# Patient Record
Sex: Female | Born: 1935 | Race: White | Hispanic: No | Smoking: Never smoker
Health system: Southern US, Community
[De-identification: ages and names within clinical notes are randomized; demographics above are authoritative.]

## PROBLEM LIST (undated history)

## (undated) DIAGNOSIS — Z853 Personal history of malignant neoplasm of breast: Secondary | ICD-10-CM

## (undated) DIAGNOSIS — C50419 Malignant neoplasm of upper-outer quadrant of unspecified female breast: Secondary | ICD-10-CM

## (undated) DIAGNOSIS — C801 Malignant (primary) neoplasm, unspecified: Secondary | ICD-10-CM

## (undated) DIAGNOSIS — I4891 Unspecified atrial fibrillation: Secondary | ICD-10-CM

## (undated) DIAGNOSIS — Z86010 Personal history of colonic polyps: Secondary | ICD-10-CM

## (undated) DIAGNOSIS — M810 Age-related osteoporosis without current pathological fracture: Secondary | ICD-10-CM

## (undated) HISTORY — DX: Age-related osteoporosis without current pathological fracture: M81.0

## (undated) HISTORY — DX: Personal history of malignant neoplasm of breast: Z85.3

## (undated) HISTORY — PX: TUBAL LIGATION: SHX77

## (undated) HISTORY — PX: COLONOSCOPY: SHX174

## (undated) HISTORY — DX: Malignant (primary) neoplasm, unspecified: C80.1

## (undated) HISTORY — PX: HAND SURGERY: SHX662

## (undated) HISTORY — DX: Unspecified atrial fibrillation: I48.91

## (undated) HISTORY — DX: Personal history of colonic polyps: Z86.010

## (undated) HISTORY — DX: Malignant neoplasm of upper-outer quadrant of unspecified female breast: C50.419

## (undated) HISTORY — PX: APPENDECTOMY: SHX54

---

## 1992-06-28 HISTORY — PX: COLON RESECTION: SHX5231

## 1999-06-29 DIAGNOSIS — C801 Malignant (primary) neoplasm, unspecified: Secondary | ICD-10-CM

## 1999-06-29 HISTORY — DX: Malignant (primary) neoplasm, unspecified: C80.1

## 1999-06-29 HISTORY — PX: BREAST LUMPECTOMY: SHX2

## 2004-04-06 ENCOUNTER — Ambulatory Visit: Payer: Self-pay | Admitting: Oncology

## 2004-09-28 ENCOUNTER — Ambulatory Visit: Payer: Self-pay | Admitting: General Surgery

## 2004-10-05 ENCOUNTER — Ambulatory Visit: Payer: Self-pay | Admitting: General Surgery

## 2004-10-06 ENCOUNTER — Ambulatory Visit: Payer: Self-pay | Admitting: Oncology

## 2005-03-31 ENCOUNTER — Ambulatory Visit: Payer: Self-pay | Admitting: General Surgery

## 2005-04-13 ENCOUNTER — Ambulatory Visit: Payer: Self-pay | Admitting: Oncology

## 2005-04-28 ENCOUNTER — Ambulatory Visit: Payer: Self-pay | Admitting: Oncology

## 2005-06-28 HISTORY — PX: MASTECTOMY: SHX3

## 2005-10-04 ENCOUNTER — Ambulatory Visit: Payer: Self-pay | Admitting: General Surgery

## 2005-10-07 ENCOUNTER — Ambulatory Visit: Payer: Self-pay | Admitting: General Surgery

## 2005-10-14 ENCOUNTER — Ambulatory Visit: Payer: Self-pay | Admitting: Oncology

## 2005-11-02 ENCOUNTER — Ambulatory Visit: Payer: Self-pay | Admitting: General Surgery

## 2005-11-02 ENCOUNTER — Other Ambulatory Visit: Payer: Self-pay

## 2005-11-08 ENCOUNTER — Ambulatory Visit: Payer: Self-pay | Admitting: General Surgery

## 2005-12-02 ENCOUNTER — Inpatient Hospital Stay: Payer: Self-pay | Admitting: General Surgery

## 2006-08-12 ENCOUNTER — Ambulatory Visit: Payer: Self-pay | Admitting: General Surgery

## 2006-09-27 ENCOUNTER — Ambulatory Visit: Payer: Self-pay | Admitting: Internal Medicine

## 2006-10-13 ENCOUNTER — Ambulatory Visit: Payer: Self-pay | Admitting: Oncology

## 2006-10-17 ENCOUNTER — Ambulatory Visit: Payer: Self-pay | Admitting: General Surgery

## 2006-10-27 ENCOUNTER — Ambulatory Visit: Payer: Self-pay | Admitting: Oncology

## 2006-10-27 ENCOUNTER — Ambulatory Visit: Payer: Self-pay | Admitting: Internal Medicine

## 2007-03-29 ENCOUNTER — Ambulatory Visit: Payer: Self-pay | Admitting: Internal Medicine

## 2007-04-11 ENCOUNTER — Ambulatory Visit: Payer: Self-pay | Admitting: Internal Medicine

## 2007-04-29 ENCOUNTER — Ambulatory Visit: Payer: Self-pay | Admitting: Internal Medicine

## 2007-06-29 HISTORY — PX: SKIN CANCER EXCISION: SHX779

## 2007-09-27 ENCOUNTER — Ambulatory Visit: Payer: Self-pay | Admitting: Internal Medicine

## 2007-10-10 ENCOUNTER — Ambulatory Visit: Payer: Self-pay | Admitting: Internal Medicine

## 2007-10-27 ENCOUNTER — Ambulatory Visit: Payer: Self-pay | Admitting: Internal Medicine

## 2007-11-06 ENCOUNTER — Ambulatory Visit: Payer: Self-pay | Admitting: General Surgery

## 2008-01-27 ENCOUNTER — Ambulatory Visit: Payer: Self-pay | Admitting: Internal Medicine

## 2008-02-15 ENCOUNTER — Ambulatory Visit: Payer: Self-pay | Admitting: Internal Medicine

## 2008-02-27 ENCOUNTER — Ambulatory Visit: Payer: Self-pay | Admitting: Internal Medicine

## 2008-04-25 ENCOUNTER — Ambulatory Visit: Payer: Self-pay | Admitting: Ophthalmology

## 2008-05-14 ENCOUNTER — Ambulatory Visit: Payer: Self-pay | Admitting: Ophthalmology

## 2008-06-28 DIAGNOSIS — Z860100 Personal history of colon polyps, unspecified: Secondary | ICD-10-CM

## 2008-06-28 HISTORY — DX: Personal history of colon polyps, unspecified: Z86.0100

## 2008-06-28 HISTORY — DX: Personal history of colonic polyps: Z86.010

## 2008-07-02 ENCOUNTER — Ambulatory Visit: Payer: Self-pay | Admitting: Ophthalmology

## 2008-07-29 ENCOUNTER — Ambulatory Visit: Payer: Self-pay | Admitting: Internal Medicine

## 2008-08-13 ENCOUNTER — Ambulatory Visit: Payer: Self-pay | Admitting: Internal Medicine

## 2008-08-26 ENCOUNTER — Ambulatory Visit: Payer: Self-pay | Admitting: Internal Medicine

## 2008-11-06 ENCOUNTER — Ambulatory Visit: Payer: Self-pay | Admitting: General Surgery

## 2009-02-14 ENCOUNTER — Ambulatory Visit: Payer: Self-pay | Admitting: Internal Medicine

## 2009-02-26 ENCOUNTER — Ambulatory Visit: Payer: Self-pay | Admitting: Internal Medicine

## 2009-07-29 ENCOUNTER — Ambulatory Visit: Payer: Self-pay | Admitting: Internal Medicine

## 2009-08-19 ENCOUNTER — Ambulatory Visit: Payer: Self-pay | Admitting: Internal Medicine

## 2009-08-26 ENCOUNTER — Ambulatory Visit: Payer: Self-pay | Admitting: Internal Medicine

## 2009-08-29 ENCOUNTER — Ambulatory Visit: Payer: Self-pay | Admitting: General Surgery

## 2009-11-17 ENCOUNTER — Ambulatory Visit: Payer: Self-pay | Admitting: General Surgery

## 2010-06-28 DIAGNOSIS — M810 Age-related osteoporosis without current pathological fracture: Secondary | ICD-10-CM

## 2010-06-28 HISTORY — DX: Age-related osteoporosis without current pathological fracture: M81.0

## 2010-08-14 ENCOUNTER — Ambulatory Visit: Payer: Self-pay | Admitting: Internal Medicine

## 2010-08-27 ENCOUNTER — Ambulatory Visit: Payer: Self-pay | Admitting: Internal Medicine

## 2010-11-19 ENCOUNTER — Ambulatory Visit: Payer: Self-pay | Admitting: General Surgery

## 2011-12-02 ENCOUNTER — Ambulatory Visit: Payer: Self-pay | Admitting: General Surgery

## 2012-09-05 ENCOUNTER — Encounter: Payer: Self-pay | Admitting: *Deleted

## 2012-09-05 DIAGNOSIS — Z860100 Personal history of colon polyps, unspecified: Secondary | ICD-10-CM | POA: Insufficient documentation

## 2012-09-18 ENCOUNTER — Ambulatory Visit (INDEPENDENT_AMBULATORY_CARE_PROVIDER_SITE_OTHER): Payer: Medicare Other | Admitting: General Surgery

## 2012-09-18 ENCOUNTER — Encounter: Payer: Self-pay | Admitting: General Surgery

## 2012-09-18 ENCOUNTER — Other Ambulatory Visit: Payer: Self-pay | Admitting: General Surgery

## 2012-09-18 VITALS — BP 116/68 | HR 70 | Resp 16 | Ht 60.0 in | Wt 132.0 lb

## 2012-09-18 DIAGNOSIS — C50911 Malignant neoplasm of unspecified site of right female breast: Secondary | ICD-10-CM

## 2012-09-18 DIAGNOSIS — C50919 Malignant neoplasm of unspecified site of unspecified female breast: Secondary | ICD-10-CM | POA: Insufficient documentation

## 2012-09-18 DIAGNOSIS — Z86010 Personal history of colonic polyps: Secondary | ICD-10-CM

## 2012-09-18 MED ORDER — POLYETHYLENE GLYCOL 3350 17 GM/SCOOP PO POWD: ORAL | Status: DC

## 2012-09-18 NOTE — Progress Notes (Signed)
Patient ID: Mindy Garza, female   DOB: 07-21-1925, 79 y.o.   MRN: [id number]  Chief Complaint  Patient presents with  . Colonoscopy    HPI Mindy Garza is a 79 y.o. female. Patient presents for a colonoscopy. Patient's last colonoscopy was done in 2010. She has a history of polyps in the past. She states she has no complaints at this time.  HPI  Past Medical History  Diagnosis Date  . Cancer 2001     right breast cancer diagnosed December 02, 2005 status post modified radical mastectomy. The patient had had an invasive carcinoma recurrence in five years after treatment for DCIS with wide excision and postoperative radiation therapy. Her tumor was estrogen positive. 0/15 nodes were negative. An axillary dissection was completed as a sentinel node could not be identified.   . Osteoporosis 2012  . Personal history of malignant neoplasm of breast 2001,2007  . Malignant neoplasm of upper-outer quadrant of female breast     Right, T1a, N0, M0 ER-positive, PR negative, HER-2/neu not overexpressing.    . Personal history of colonic polyps 2010    tubulovillous polyp of the appendiceal orifice      Past Surgical History  Procedure Laterality Date  . Colonoscopy  2010    Dr. Lemar Livings  . Skin cancer excision  2009  . Breast lumpectomy Right 2001  . Mastectomy Right 2007  . Colon resection  1994  . Hand surgery    . Tubal ligation    . Appendectomy      Family History  Problem Relation Age of Onset  . Breast cancer      Social History History  Substance Use Topics  . Smoking status: Never Smoker   . Smokeless tobacco: Never Used  . Alcohol Use: No    No Known Allergies  Current Outpatient Prescriptions  Medication Sig Dispense Refill  . aspirin 81 MG tablet Take 81 mg by mouth daily.      . brimonidine-timolol (COMBIGAN) 0.2-0.5 % ophthalmic solution Place 1 drop into both eyes once.      . Calcium Carbonate-Vitamin D (CALCIUM 500/VITAMIN D PO) Take 1,500 mg by mouth daily.      .  cyanocobalamin 100 MCG tablet Take 1,000 mcg by mouth as directed.      . fish oil-omega-3 fatty acids 1000 MG capsule Take 2 g by mouth daily.      . fluticasone (FLONASE) 50 MCG/ACT nasal spray Place 2 sprays into the nose daily.      Marland Kitchen LORazepam (ATIVAN) 1 MG tablet Take 1 mg by mouth every 8 (eight) hours.      . Multiple Vitamin (MULTIVITAMIN) tablet Take 1 tablet by mouth daily.      . polyethylene glycol powder (GLYCOLAX/MIRALAX) powder 255 grams one bottle for colonoscopy prep  255 g  0   No current facility-administered medications for this visit.    Review of Systems Review of Systems  Constitutional: Negative.   Respiratory: Negative.   Cardiovascular: Negative.   Gastrointestinal: Negative.     Blood pressure 116/68, pulse 70, resp. rate 16, height 5' (1.524 m), weight 132 lb (59.875 kg).  Physical Exam Physical Exam  Constitutional: She appears well-developed and well-nourished.  Cardiovascular: Normal rate, regular rhythm and normal heart sounds.   Pulmonary/Chest: Effort normal and breath sounds normal.  Abdominal: Soft. Normal appearance. She exhibits no mass.    Data Reviewed 2010 colonoscopy results  Assessment    Tubulovillous adenoma of the appendiceal orifice.  Plan    The recommendation for follow up colonoscopy was reviewed with the patient in person and with her daughter, Cephus Slater, R.N. by phone.       Mindy Garza 09/18/2012, 7:04 PM

## 2012-09-18 NOTE — Patient Instructions (Signed)
Patient has been scheduled for a colonoscopy on 11-08-12 at [facility].

## 2012-10-30 ENCOUNTER — Telehealth: Payer: Self-pay | Admitting: *Deleted

## 2012-10-30 NOTE — Telephone Encounter (Signed)
Patient reports that she is on the same medications since last office visit. She has already stopped fish oil. Patient also thinks her daughter has taken care of pre-registering her for colonoscopy. We will proceed with colonoscopy that is scheduled at [facility] for 11-08-12. This patient is aware to call if she has further questions.

## 2012-11-08 ENCOUNTER — Ambulatory Visit: Payer: Self-pay | Admitting: General Surgery

## 2012-11-08 DIAGNOSIS — D126 Benign neoplasm of colon, unspecified: Secondary | ICD-10-CM

## 2012-11-09 LAB — PATHOLOGY REPORT

## 2012-11-13 ENCOUNTER — Telehealth: Payer: Self-pay | Admitting: General Surgery

## 2012-11-13 NOTE — Telephone Encounter (Signed)
The patient's colonoscopy of 11/08/2012 showed enlargement of the previous identified tubulovillous adenoma of the appendiceal orifice. Pathology did not show any dysplasia.  Options for management were discussed: 1) "radical appendectomy" to remove all the villous adenoma tissue versus 2) continue surveillance endoscopy.  Mrs. Mindy Garza will talk with her mother in person and will make arrangements for an afternoon appointment to discuss options in person.

## 2012-11-15 ENCOUNTER — Encounter: Payer: Self-pay | Admitting: General Surgery

## 2012-12-05 ENCOUNTER — Ambulatory Visit: Payer: Self-pay | Admitting: General Surgery

## 2012-12-06 ENCOUNTER — Encounter: Payer: Self-pay | Admitting: General Surgery

## 2012-12-19 ENCOUNTER — Encounter: Payer: Self-pay | Admitting: General Surgery

## 2012-12-19 ENCOUNTER — Ambulatory Visit (INDEPENDENT_AMBULATORY_CARE_PROVIDER_SITE_OTHER): Payer: Medicare Other | Admitting: General Surgery

## 2012-12-19 VITALS — BP 116/62 | HR 72 | Resp 14 | Ht 63.0 in | Wt 132.0 lb

## 2012-12-19 DIAGNOSIS — C50919 Malignant neoplasm of unspecified site of unspecified female breast: Secondary | ICD-10-CM

## 2012-12-19 DIAGNOSIS — Z86010 Personal history of colonic polyps: Secondary | ICD-10-CM

## 2012-12-19 DIAGNOSIS — C50911 Malignant neoplasm of unspecified site of right female breast: Secondary | ICD-10-CM

## 2012-12-19 NOTE — Patient Instructions (Addendum)
Continue self breast exams. Call office for any new breast issues or concerns. Follow up in 1 yr left mammogram and office visit  Risk and benefits reviewed regarding surgery for polyp removal, with one night stay in hospital with the plan for resuming her activity over a week.  Patient's surgery has been scheduled for 02-01-13 at [facility]. It is okay for patient to continue 81 mg aspirin.

## 2012-12-19 NOTE — Progress Notes (Addendum)
Patient ID: Mindy Garza, female   DOB: 09/18/1925, 79 y.o.   MRN: [id number]  Chief Complaint  Patient presents with  . Follow-up    mammogram    HPI Mindy Garza is a 79 y.o. female.  Patient here today for follow up left mammogram. Follow up from colonoscopy done 11-08-12 as well. Patient with known history of right breast cancer. No new breast complaints.  Patient had right breast cancer diagnosed December 02, 2005 status post modified radical mastectomy. The patient had had an invasive carcinoma recurrence in five years after treatment for DCIS with wide excision and postoperative radiation therapy. Her tumor was estrogen positive. 0/15 nodes were negative. An axillary dissection was completed as a sentinel node could not be identified.  The patient is accompanied today by her daughter and son-in-law.  She reports no difficulty with her right mastectomy site. She has not had any GI difficulties after her colonoscopy, although she reports it does take her quite a while to get over the prep. HPI  Past Medical History  Diagnosis Date  . Cancer 2001     right breast cancer diagnosed December 02, 2005 status post modified radical mastectomy. The patient had had an invasive carcinoma recurrence in five years after treatment for DCIS with wide excision and postoperative radiation therapy. Her tumor was estrogen positive. 0/15 nodes were negative. An axillary dissection was completed as a sentinel node could not be identified.   . Osteoporosis 2012  . Personal history of malignant neoplasm of breast 2001,2007  . Malignant neoplasm of upper-outer quadrant of female breast     Right, T1a, N0, M0 ER-positive, PR negative, HER-2/neu not overexpressing.    . Personal history of colonic polyps 2010    tubulovillous polyp of the appendiceal orifice      Past Surgical History  Procedure Laterality Date  . Colonoscopy  2010, 2014    Dr. Lemar Livings  . Skin cancer excision  2009  . Breast lumpectomy Right 2001  .  Mastectomy Right 2007  . Colon resection  1994  . Hand surgery    . Tubal ligation    . Appendectomy      Family History  Problem Relation Age of Onset  . Breast cancer      Social History History  Substance Use Topics  . Smoking status: Never Smoker   . Smokeless tobacco: Never Used  . Alcohol Use: No    No Known Allergies  Current Outpatient Prescriptions  Medication Sig Dispense Refill  . aspirin 81 MG tablet Take 81 mg by mouth daily.      . brimonidine-timolol (COMBIGAN) 0.2-0.5 % ophthalmic solution Place 1 drop into both eyes once.      . Calcium Carbonate-Vitamin D (CALCIUM 500/VITAMIN D PO) Take 1,500 mg by mouth daily.      . Cyanocobalamin (VITAMIN B-12 IJ) Inject 1 ampule as directed every 30 (thirty) days.      . fish oil-omega-3 fatty acids 1000 MG capsule Take 2 g by mouth daily.      . fluticasone (FLONASE) 50 MCG/ACT nasal spray Place 2 sprays into the nose daily.      Marland Kitchen LORazepam (ATIVAN) 1 MG tablet Take 1 mg by mouth every 8 (eight) hours.      . Multiple Vitamin (MULTIVITAMIN) tablet Take 1 tablet by mouth daily.       No current facility-administered medications for this visit.    Review of Systems Review of Systems  Constitutional: Negative.   Respiratory:  Negative.   Cardiovascular: Negative.     Blood pressure 116/62, pulse 72, resp. rate 14, height 5\' 3"  (1.6 m), weight 132 lb (59.875 kg).  Physical Exam Physical Exam  Constitutional: She is oriented to person, place, and time. She appears well-developed and well-nourished.  Cardiovascular: Normal rate, regular rhythm and normal heart sounds.   Pulmonary/Chest: Left breast exhibits no inverted nipple, no mass, no nipple discharge, no skin change and no tenderness.  Abdominal: Soft.  Lymphadenopathy:    She has no cervical adenopathy.    She has no axillary adenopathy.  Neurological: She is alert and oriented to person, place, and time.  Skin: Skin is warm and dry.  Scoliosis  Right  mastectomy site well healed scar  Data Reviewed 25 mm tubulovillous adenoma the appendiceal orifice. No atypia or dysplasia identified. Left breast mammogram dated 12/05/2012 was unremarkable. BI-RAD-1.  Assessment    Doing well status post right mastectomy without evidence of recurrent disease.  Enlarging tubulovillous adenoma the appendiceal orifice.    Plan    Followup left breast mammogram in one year.  Options for management of the polyp in the cecum were reviewed. There is evidence that a limited resection for biopsy proven benign lesions is an acceptable approach. This would essentially be an "radical" appendectomy involving the cecum without an anastomosis.  The risks associated with surgery including those related to bleeding infection, scar tissue, pain and the like were reviewed. Her daughter is a Engineer, civil (consulting), her son-in-law physician. We're all aware of the risks associated with surgery in an 79 year old woman.    Patient's surgery has been scheduled for 02-01-13 at [facility]. It is okay for patient to continue 81 mg aspirin.    Earline Mayotte 12/20/2012, 7:25 AM

## 2012-12-20 ENCOUNTER — Encounter: Payer: Self-pay | Admitting: General Surgery

## 2013-01-22 ENCOUNTER — Other Ambulatory Visit: Payer: Self-pay | Admitting: General Surgery

## 2013-01-22 DIAGNOSIS — Z86010 Personal history of colonic polyps: Secondary | ICD-10-CM

## 2013-01-23 ENCOUNTER — Observation Stay: Payer: Self-pay | Admitting: Internal Medicine

## 2013-01-23 ENCOUNTER — Telehealth: Payer: Self-pay | Admitting: *Deleted

## 2013-01-23 ENCOUNTER — Ambulatory Visit: Payer: Self-pay | Admitting: General Surgery

## 2013-01-23 DIAGNOSIS — I498 Other specified cardiac arrhythmias: Secondary | ICD-10-CM

## 2013-01-23 LAB — CBC WITH DIFFERENTIAL/PLATELET
Basophil #: 0.1 10*3/uL (ref 0.0–0.1)
Basophil %: 0.6 %
Eosinophil %: 3.2 %
HCT: 42 % (ref 35.0–47.0)
HGB: 14.5 g/dL (ref 12.0–16.0)
Lymphocyte #: 2 10*3/uL (ref 1.0–3.6)
MCV: 88 fL (ref 80–100)
Monocyte %: 8.6 %
Neutrophil %: 67.1 %
RBC: 4.79 10*6/uL (ref 3.80–5.20)
RDW: 14.1 % (ref 11.5–14.5)

## 2013-01-23 LAB — TROPONIN I: Troponin-I: <0.02 ng/mL

## 2013-01-23 LAB — URINALYSIS, COMPLETE
Bilirubin,UR: NEGATIVE
Blood: NEGATIVE
Glucose,UR: NEGATIVE mg/dL (ref 0–75)
Leukocyte Esterase: NEGATIVE
Nitrite: NEGATIVE
Ph: 7 (ref 4.5–8.0)
Protein: NEGATIVE
RBC,UR: <1 /HPF (ref 0–5)
Squamous Epithelial: <1

## 2013-01-23 LAB — TSH: Thyroid Stimulating Horm: 1.18 u[IU]/mL

## 2013-01-23 LAB — BASIC METABOLIC PANEL
Creatinine: 0.8 mg/dL (ref 0.60–1.30)
EGFR (Non-African Amer.): >60
Glucose: 100 mg/dL — ABNORMAL HIGH (ref 65–99)
Osmolality: 277 (ref 275–301)

## 2013-01-23 NOTE — Telephone Encounter (Signed)
Per Toni Amend at the answering service, Cordelia Pen with anesthesia called wanting [place] to know that Pre-admit sent patient to the Emergency Room due to the EKG that she had done today.  Dr. Lemar Livings has been notified.

## 2013-01-24 LAB — MAGNESIUM: Magnesium: 2.2 mg/dL

## 2013-01-24 LAB — LIPID PANEL: Cholesterol: 166 mg/dL (ref 0–200)

## 2013-01-24 NOTE — Addendum Note (Signed)
Addended by: Earline Mayotte on: 01/24/2013 02:02 PM   Modules accepted: Orders

## 2013-01-25 ENCOUNTER — Telehealth: Payer: Self-pay | Admitting: *Deleted

## 2013-01-25 NOTE — Telephone Encounter (Signed)
Patient's surgery that was scheduled for 02-01-13 has been cancelled at this time. She is to see Dr. Juliann Pares either 01-26-13 or 01-29-13. We will be awaiting cardiac clearance and once obtained we can reschedule surgery.  Message left on the O.R. posting line to cancel case.

## 2013-03-07 ENCOUNTER — Ambulatory Visit: Payer: Self-pay | Admitting: Cardiology

## 2013-10-02 ENCOUNTER — Ambulatory Visit: Payer: Self-pay | Admitting: Internal Medicine

## 2013-11-05 ENCOUNTER — Ambulatory Visit: Payer: Self-pay | Admitting: Orthopedic Surgery

## 2013-12-25 ENCOUNTER — Encounter: Payer: Self-pay | Admitting: General Surgery

## 2013-12-25 ENCOUNTER — Ambulatory Visit: Payer: Self-pay | Admitting: General Surgery

## 2013-12-26 ENCOUNTER — Encounter: Payer: Self-pay | Admitting: General Surgery

## 2013-12-31 ENCOUNTER — Encounter: Payer: Self-pay | Admitting: General Surgery

## 2013-12-31 ENCOUNTER — Ambulatory Visit (INDEPENDENT_AMBULATORY_CARE_PROVIDER_SITE_OTHER): Payer: Medicare Other | Admitting: General Surgery

## 2013-12-31 VITALS — BP 126/80 | HR 140 | Resp 16 | Ht 63.0 in | Wt 130.0 lb

## 2013-12-31 DIAGNOSIS — I4891 Unspecified atrial fibrillation: Secondary | ICD-10-CM

## 2013-12-31 DIAGNOSIS — R000 Tachycardia, unspecified: Secondary | ICD-10-CM

## 2013-12-31 DIAGNOSIS — Z853 Personal history of malignant neoplasm of breast: Secondary | ICD-10-CM

## 2013-12-31 DIAGNOSIS — I482 Chronic atrial fibrillation, unspecified: Secondary | ICD-10-CM | POA: Insufficient documentation

## 2013-12-31 NOTE — Progress Notes (Signed)
Patient ID: Mindy Garza, female   DOB: 18-Dec-1925, 80 y.o.   MRN: [id number]  Chief Complaint  Patient presents with  . Follow-up    mammogram    HPI Mindy Garza is a 80 y.o. female who presents for a breast evaluation. The most recent left breast mammogram was done on 12/25/13. Patient does perform regular self breast checks and gets regular mammograms done.  The patient denies any problems with her breast at this time.   HPI  Past Medical History  Diagnosis Date  . Cancer 2001     right breast cancer diagnosed December 02, 2005 status post modified radical mastectomy. The patient had had an invasive carcinoma recurrence in five years after treatment for DCIS with wide excision and postoperative radiation therapy. Her tumor was estrogen positive. 0/15 nodes were negative. An axillary dissection was completed as a sentinel node could not be identified.   . Osteoporosis 2012  . Personal history of malignant neoplasm of breast 2001,2007  . Malignant neoplasm of upper-outer quadrant of female breast     Right, T1a, N0, M0 ER-positive, PR negative, HER-2/neu not overexpressing.    . Personal history of colonic polyps 2010    tubulovillous polyp of the appendiceal orifice    . Atrial fibrillation     followed by Jordan Hawks, MD    Past Surgical History  Procedure Laterality Date  . Colonoscopy  2010, 2014    Dr. Bary Castilla  . Skin cancer excision  2009  . Breast lumpectomy Right 2001  . Mastectomy Right 2007  . Colon resection  1994  . Hand surgery    . Tubal ligation    . Appendectomy      Family History  Problem Relation Age of Onset  . Breast cancer      Social History History  Substance Use Topics  . Smoking status: Never Smoker   . Smokeless tobacco: Never Used  . Alcohol Use: No    No Known Allergies  Current Outpatient Prescriptions  Medication Sig Dispense Refill  . brimonidine-timolol (COMBIGAN) 0.2-0.5 % ophthalmic solution Place 1 drop into both eyes once.      .  Calcium Carbonate-Vitamin D (CALCIUM 500/VITAMIN D PO) Take 1,500 mg by mouth daily.      . Cyanocobalamin (VITAMIN B-12 IJ) Inject 1 ampule as directed every 30 (thirty) days.      Marland Kitchen diltiazem (DILACOR XR) 180 MG 24 hr capsule Take 180 mg by mouth daily.      . fish oil-omega-3 fatty acids 1000 MG capsule Take 2 g by mouth daily.      . fluticasone (FLONASE) 50 MCG/ACT nasal spray Place 2 sprays into the nose daily.      Marland Kitchen LORazepam (ATIVAN) 1 MG tablet Take 1 mg by mouth every 8 (eight) hours.      . Multiple Vitamin (MULTIVITAMIN) tablet Take 1 tablet by mouth daily.      . vitamin E 1000 UNIT capsule Take 1,000 Units by mouth daily.      Marland Kitchen warfarin (COUMADIN) 1 MG tablet Take 1 mg by mouth daily.      . zoledronic acid (RECLAST) 5 MG/100ML SOLN injection Inject 5 mg into the vein once.       No current facility-administered medications for this visit.    Review of Systems Review of Systems  Constitutional: Negative.   Respiratory: Negative.   Cardiovascular: Negative.     Blood pressure 126/80, pulse 140, resp. rate 16, height 5'  3" (1.6 m), weight 130 lb (58.968 kg).  Physical Exam Physical Exam  Constitutional: She is oriented to person, place, and time. She appears well-developed and well-nourished.  Eyes: Conjunctivae are normal. No scleral icterus.  Neck: Neck supple.  Cardiovascular: Regular rhythm and normal heart sounds.  Tachycardia present.   No murmur heard. Afib Dr. Ubaldo Glassing to follow.  The rhythm is regular to auscultation. Peripherally, incompletely perfused beats are identified.  No jugular venous distention with the patient 45. With abdominal compressionthere is mild hepatojugular reflux.  Pulmonary/Chest: Effort normal and breath sounds normal. No accessory muscle usage. Not tachypneic. No respiratory distress. Left breast exhibits no inverted nipple, no mass, no nipple discharge, no skin change and no tenderness.  Well healed right mastectomy site.      Abdominal: Soft. Bowel sounds are normal. There is no tenderness.  Musculoskeletal:  Right calf measures 33.5  Left calf measures 34.5  Lymphadenopathy:    She has no cervical adenopathy.    She has no axillary adenopathy.  Neurological: She is alert and oriented to person, place, and time.  Skin: Skin is warm and dry.    Data Reviewed Left breast mammogram dated December 25, 2013 was reviewed and compared to previous studies. No evidence of malignancy. BI-RAD-1. Lower extremity ultrasound completed earlier this year showed no evidence of DVT.  Lower extremity MRI requested by the orthopedic service suggested asymmetric subcutaneous edema throughout the right lower leg with a small peripherally enhancing lesion 6 mm in diameter adjacent to the peroneal musculature.  Assessment     No evidence of recurrent right breast cancer.  Tachycardia with history of atrial fibrillation.    Plan    I spoke with Fulton Reek, M.D. Regarding the patient's elevated heart rate. He reports her last INR completed on December 05, 2013 was 2.7. She is being followed by Bartholome Bill, M.D. From cardiology.  If she is totally asymptomatic, arrangements have been made to be evaluated by Dr. Doy Hutching on Tuesday, July 7 at 2:45 PM.  The patient reported previous right calf swelling and orthopedic evaluation. In light of the absence of any evidence of calf swelling at this time I don't believe that any further followup is required.       PCP: Damaris Hippo 12/31/2013, 8:21 PM

## 2013-12-31 NOTE — Patient Instructions (Signed)
Patient to return in one year left breast screening mammogram.  

## 2014-04-29 ENCOUNTER — Encounter: Payer: Self-pay | Admitting: General Surgery

## 2014-05-01 LAB — BASIC METABOLIC PANEL
Anion Gap: 10 (ref 7–16)
BUN: 34 mg/dL — ABNORMAL HIGH (ref 7–18)
CO2: 29 mmol/L (ref 21–32)
Calcium, Total: 9 mg/dL (ref 8.5–10.1)
Chloride: 101 mmol/L (ref 98–107)
Creatinine: 1.49 mg/dL — ABNORMAL HIGH (ref 0.60–1.30)
EGFR (Non-African Amer.): 35 — ABNORMAL LOW
GFR CALC AF AMER: 43 — AB
Glucose: 153 mg/dL — ABNORMAL HIGH (ref 65–99)
Osmolality: 290 (ref 275–301)
Potassium: 3.8 mmol/L (ref 3.5–5.1)
Sodium: 140 mmol/L (ref 136–145)

## 2014-05-01 LAB — CBC
HCT: 48.3 % — ABNORMAL HIGH (ref 35.0–47.0)
HGB: 15.6 g/dL (ref 12.0–16.0)
MCH: 29.4 pg (ref 26.0–34.0)
MCHC: 32.3 g/dL (ref 32.0–36.0)
MCV: 91 fL (ref 80–100)
Platelet: 233 10*3/uL (ref 150–440)
RBC: 5.3 10*6/uL — AB (ref 3.80–5.20)
RDW: 14.2 % (ref 11.5–14.5)
WBC: 24.6 10*3/uL — AB (ref 3.6–11.0)

## 2014-05-01 LAB — URINALYSIS, COMPLETE
BILIRUBIN, UR: NEGATIVE
BLOOD: NEGATIVE
GLUCOSE, UR: NEGATIVE mg/dL (ref 0–75)
Hyaline Cast: 43
Ketone: NEGATIVE
Leukocyte Esterase: NEGATIVE
Nitrite: NEGATIVE
PH: 5 (ref 4.5–8.0)
Protein: 100
RBC,UR: 2 /HPF (ref 0–5)
Specific Gravity: 1.023 (ref 1.003–1.030)
Squamous Epithelial: 1
WBC UR: <1 /HPF (ref 0–5)

## 2014-05-01 LAB — TROPONIN I
Troponin-I: 0.11 ng/mL — ABNORMAL HIGH
Troponin-I: 0.14 ng/mL — ABNORMAL HIGH

## 2014-05-01 LAB — CK-MB: CK-MB: 76.7 ng/mL — AB (ref 0.5–3.6)

## 2014-05-02 ENCOUNTER — Inpatient Hospital Stay: Payer: Self-pay | Admitting: Internal Medicine

## 2014-05-02 LAB — LIPID PANEL
Cholesterol: 122 mg/dL (ref 0–200)
HDL: 35 mg/dL — AB (ref 40–60)
LDL CHOLESTEROL, CALC: 75 mg/dL (ref 0–100)
TRIGLYCERIDES: 60 mg/dL (ref 0–200)
VLDL CHOLESTEROL, CALC: 12 mg/dL (ref 5–40)

## 2014-05-02 LAB — CBC WITH DIFFERENTIAL/PLATELET
Basophil #: 0.1 10*3/uL (ref 0.0–0.1)
Basophil %: 0.6 %
Eosinophil #: 0 10*3/uL (ref 0.0–0.7)
Eosinophil %: 0.1 %
HCT: 41 % (ref 35.0–47.0)
HGB: 13.4 g/dL (ref 12.0–16.0)
Lymphocyte #: 1.4 10*3/uL (ref 1.0–3.6)
Lymphocyte %: 6 %
MCH: 29.8 pg (ref 26.0–34.0)
MCHC: 32.7 g/dL (ref 32.0–36.0)
MCV: 91 fL (ref 80–100)
MONO ABS: 1.5 x10 3/mm — AB (ref 0.2–0.9)
Monocyte %: 6.5 %
Neutrophil #: 20.4 10*3/uL — ABNORMAL HIGH (ref 1.4–6.5)
Neutrophil %: 86.8 %
Platelet: 207 10*3/uL (ref 150–440)
RBC: 4.5 10*6/uL (ref 3.80–5.20)
RDW: 14.2 % (ref 11.5–14.5)
WBC: 23.5 10*3/uL — ABNORMAL HIGH (ref 3.6–11.0)

## 2014-05-02 LAB — BASIC METABOLIC PANEL
Anion Gap: 7 (ref 7–16)
BUN: 33 mg/dL — AB (ref 7–18)
CALCIUM: 7.7 mg/dL — AB (ref 8.5–10.1)
Chloride: 109 mmol/L — ABNORMAL HIGH (ref 98–107)
Co2: 28 mmol/L (ref 21–32)
Creatinine: 0.99 mg/dL (ref 0.60–1.30)
EGFR (African American): >60
GFR CALC NON AF AMER: 57 — AB
Glucose: 140 mg/dL — ABNORMAL HIGH (ref 65–99)
OSMOLALITY: 296 (ref 275–301)
POTASSIUM: 3.1 mmol/L — AB (ref 3.5–5.1)
SODIUM: 144 mmol/L (ref 136–145)

## 2014-05-02 LAB — TROPONIN I: TROPONIN-I: 0.17 ng/mL — AB

## 2014-05-02 LAB — TSH: THYROID STIMULATING HORM: 0.411 u[IU]/mL — AB

## 2014-05-02 LAB — CK-MB: CK-MB: 37.7 ng/mL — ABNORMAL HIGH (ref 0.5–3.6)

## 2014-05-03 LAB — COMPREHENSIVE METABOLIC PANEL
ALK PHOS: 118 U/L — AB
ALT: 231 U/L — AB
Albumin: 2.2 g/dL — ABNORMAL LOW (ref 3.4–5.0)
Anion Gap: 6 — ABNORMAL LOW (ref 7–16)
BILIRUBIN TOTAL: 0.9 mg/dL (ref 0.2–1.0)
BUN: 17 mg/dL (ref 7–18)
CALCIUM: 7.7 mg/dL — AB (ref 8.5–10.1)
CO2: 28 mmol/L (ref 21–32)
CREATININE: 0.64 mg/dL (ref 0.60–1.30)
Chloride: 109 mmol/L — ABNORMAL HIGH (ref 98–107)
EGFR (African American): >60
EGFR (Non-African Amer.): >60
GLUCOSE: 84 mg/dL (ref 65–99)
Osmolality: 286 (ref 275–301)
POTASSIUM: 4.1 mmol/L (ref 3.5–5.1)
SGOT(AST): 175 U/L — ABNORMAL HIGH (ref 15–37)
Sodium: 143 mmol/L (ref 136–145)
Total Protein: 5.9 g/dL — ABNORMAL LOW (ref 6.4–8.2)

## 2014-05-03 LAB — URINE CULTURE

## 2014-05-03 LAB — CBC WITH DIFFERENTIAL/PLATELET
BASOS PCT: 0.8 %
Basophil #: 0.1 10*3/uL (ref 0.0–0.1)
EOS ABS: 0.1 10*3/uL (ref 0.0–0.7)
EOS PCT: 1 %
HCT: 44.8 % (ref 35.0–47.0)
HGB: 14.7 g/dL (ref 12.0–16.0)
LYMPHS ABS: 1.4 10*3/uL (ref 1.0–3.6)
LYMPHS PCT: 11 %
MCH: 30.1 pg (ref 26.0–34.0)
MCHC: 32.8 g/dL (ref 32.0–36.0)
MCV: 92 fL (ref 80–100)
MONO ABS: 1.1 x10 3/mm — AB (ref 0.2–0.9)
Monocyte %: 8.8 %
Neutrophil #: 9.9 10*3/uL — ABNORMAL HIGH (ref 1.4–6.5)
Neutrophil %: 78.4 %
Platelet: 213 10*3/uL (ref 150–440)
RBC: 4.88 10*6/uL (ref 3.80–5.20)
RDW: 14.5 % (ref 11.5–14.5)
WBC: 12.6 10*3/uL — AB (ref 3.6–11.0)

## 2014-05-04 LAB — CBC WITH DIFFERENTIAL/PLATELET
BASOS ABS: 0.1 10*3/uL (ref 0.0–0.1)
BASOS PCT: 0.5 %
Eosinophil #: 0.2 10*3/uL (ref 0.0–0.7)
Eosinophil %: 1.3 %
HCT: 47.3 % — AB (ref 35.0–47.0)
HGB: 15.8 g/dL (ref 12.0–16.0)
Lymphocyte #: 1.4 10*3/uL (ref 1.0–3.6)
Lymphocyte %: 10.5 %
MCH: 30 pg (ref 26.0–34.0)
MCHC: 33.3 g/dL (ref 32.0–36.0)
MCV: 90 fL (ref 80–100)
Monocyte #: 1.6 x10 3/mm — ABNORMAL HIGH (ref 0.2–0.9)
Monocyte %: 11.6 %
NEUTROS PCT: 76.1 %
Neutrophil #: 10.3 10*3/uL — ABNORMAL HIGH (ref 1.4–6.5)
Platelet: 229 10*3/uL (ref 150–440)
RBC: 5.24 10*6/uL — ABNORMAL HIGH (ref 3.80–5.20)
RDW: 14.4 % (ref 11.5–14.5)
WBC: 13.5 10*3/uL — AB (ref 3.6–11.0)

## 2014-05-04 LAB — COMPREHENSIVE METABOLIC PANEL
ANION GAP: 5 — AB (ref 7–16)
Albumin: 2.3 g/dL — ABNORMAL LOW (ref 3.4–5.0)
Alkaline Phosphatase: 173 U/L — ABNORMAL HIGH
BUN: 10 mg/dL (ref 7–18)
Bilirubin,Total: 0.9 mg/dL (ref 0.2–1.0)
CREATININE: 0.61 mg/dL (ref 0.60–1.30)
Calcium, Total: 8.2 mg/dL — ABNORMAL LOW (ref 8.5–10.1)
Chloride: 108 mmol/L — ABNORMAL HIGH (ref 98–107)
Co2: 27 mmol/L (ref 21–32)
EGFR (Non-African Amer.): >60
GLUCOSE: 96 mg/dL (ref 65–99)
OSMOLALITY: 278 (ref 275–301)
POTASSIUM: 4.2 mmol/L (ref 3.5–5.1)
SGOT(AST): 130 U/L — ABNORMAL HIGH (ref 15–37)
SGPT (ALT): 206 U/L — ABNORMAL HIGH
Sodium: 140 mmol/L (ref 136–145)
Total Protein: 6.3 g/dL — ABNORMAL LOW (ref 6.4–8.2)

## 2014-05-04 LAB — TROPONIN I: Troponin-I: 0.02 ng/mL

## 2014-05-05 LAB — COMPREHENSIVE METABOLIC PANEL WITH GFR
Albumin: 2.1 g/dL — ABNORMAL LOW
Alkaline Phosphatase: 169 U/L — ABNORMAL HIGH
Anion Gap: 8
BUN: 12 mg/dL
Bilirubin,Total: 0.6 mg/dL
Calcium, Total: 8.1 mg/dL — ABNORMAL LOW
Chloride: 107 mmol/L
Co2: 27 mmol/L
Creatinine: 0.64 mg/dL
EGFR (African American): >60
EGFR (Non-African Amer.): >60
Glucose: 92 mg/dL
Osmolality: 283
Potassium: 4.1 mmol/L
SGOT(AST): 69 U/L — ABNORMAL HIGH
SGPT (ALT): 143 U/L — ABNORMAL HIGH
Sodium: 142 mmol/L
Total Protein: 6 g/dL — ABNORMAL LOW

## 2014-05-05 LAB — CBC WITH DIFFERENTIAL/PLATELET
Basophil #: 0.1 x10 3/mm 3
Basophil %: 0.5 %
Eosinophil #: 0.2 x10 3/mm 3
Eosinophil %: 2.1 %
HCT: 44.9 %
HGB: 15 g/dL
Lymphocyte %: 11.7 %
Lymphs Abs: 1.3 x10 3/mm 3
MCH: 30.2 pg
MCHC: 33.5 g/dL
MCV: 90 fL
Monocyte #: 1.2 "x10 3/mm " — ABNORMAL HIGH
Monocyte %: 11.3 %
Neutrophil #: 8.2 x10 3/mm 3 — ABNORMAL HIGH
Neutrophil %: 74.4 %
Platelet: 272 x10 3/mm 3
RBC: 4.97 X10 6/mm 3
RDW: 14.2 %
WBC: 11 x10 3/mm 3

## 2014-05-06 LAB — COMPREHENSIVE METABOLIC PANEL
ALBUMIN: 2.2 g/dL — AB (ref 3.4–5.0)
ALK PHOS: 227 U/L — AB
ANION GAP: 16 (ref 7–16)
BUN: 11 mg/dL (ref 7–18)
Bilirubin,Total: 0.7 mg/dL (ref 0.2–1.0)
CALCIUM: 7.8 mg/dL — AB (ref 8.5–10.1)
Chloride: 107 mmol/L (ref 98–107)
Co2: 14 mmol/L — ABNORMAL LOW (ref 21–32)
Glucose: 70 mg/dL (ref 65–99)
OSMOLALITY: 272 (ref 275–301)
POTASSIUM: 5.1 mmol/L (ref 3.5–5.1)
SGOT(AST): 84 U/L — ABNORMAL HIGH (ref 15–37)
SGPT (ALT): 132 U/L — ABNORMAL HIGH
Sodium: 137 mmol/L (ref 136–145)
Total Protein: 6.6 g/dL (ref 6.4–8.2)

## 2014-05-06 LAB — CREATININE, SERUM
Creatinine: 0.71 mg/dL (ref 0.60–1.30)
EGFR (African American): >60

## 2014-05-06 LAB — CULTURE, BLOOD (SINGLE)

## 2014-06-25 ENCOUNTER — Inpatient Hospital Stay: Payer: Self-pay | Admitting: Internal Medicine

## 2014-06-25 LAB — BASIC METABOLIC PANEL
Anion Gap: 8 (ref 7–16)
BUN: 12 mg/dL (ref 7–18)
Calcium, Total: 8.4 mg/dL — ABNORMAL LOW (ref 8.5–10.1)
Chloride: 104 mmol/L (ref 98–107)
Co2: 25 mmol/L (ref 21–32)
Creatinine: 0.75 mg/dL (ref 0.60–1.30)
EGFR (Non-African Amer.): >60
Glucose: 117 mg/dL — ABNORMAL HIGH (ref 65–99)
OSMOLALITY: 275 (ref 275–301)
POTASSIUM: 3.4 mmol/L — AB (ref 3.5–5.1)
SODIUM: 137 mmol/L (ref 136–145)

## 2014-06-25 LAB — CBC WITH DIFFERENTIAL/PLATELET
Basophil #: 0.1 10*3/uL (ref 0.0–0.1)
Basophil %: 0.4 %
EOS PCT: 0 %
Eosinophil #: 0 10*3/uL (ref 0.0–0.7)
HCT: 42.7 % (ref 35.0–47.0)
HGB: 13.9 g/dL (ref 12.0–16.0)
LYMPHS ABS: 1.3 10*3/uL (ref 1.0–3.6)
Lymphocyte %: 5.2 %
MCH: 29.3 pg (ref 26.0–34.0)
MCHC: 32.7 g/dL (ref 32.0–36.0)
MCV: 90 fL (ref 80–100)
Monocyte #: 2 x10 3/mm — ABNORMAL HIGH (ref 0.2–0.9)
Monocyte %: 7.7 %
NEUTROS PCT: 86.7 %
Neutrophil #: 21.9 10*3/uL — ABNORMAL HIGH (ref 1.4–6.5)
PLATELETS: 230 10*3/uL (ref 150–440)
RBC: 4.76 10*6/uL (ref 3.80–5.20)
RDW: 14.8 % — AB (ref 11.5–14.5)
WBC: 25.3 10*3/uL — ABNORMAL HIGH (ref 3.6–11.0)

## 2014-06-25 LAB — URINALYSIS, COMPLETE
BILIRUBIN, UR: NEGATIVE
BLOOD: NEGATIVE
GLUCOSE, UR: NEGATIVE mg/dL (ref 0–75)
Ketone: NEGATIVE
NITRITE: NEGATIVE
PROTEIN: NEGATIVE
Ph: 7 (ref 4.5–8.0)
RBC,UR: 3 /HPF (ref 0–5)
Specific Gravity: 1.008 (ref 1.003–1.030)
Squamous Epithelial: 2

## 2014-06-25 LAB — TROPONIN I: TROPONIN-I: 0.04 ng/mL

## 2014-06-26 LAB — CBC WITH DIFFERENTIAL/PLATELET
Basophil #: 0.1 10*3/uL (ref 0.0–0.1)
Basophil %: 0.4 %
EOS ABS: 0 10*3/uL (ref 0.0–0.7)
EOS PCT: 0 %
HCT: 38.5 % (ref 35.0–47.0)
HGB: 12.7 g/dL (ref 12.0–16.0)
LYMPHS ABS: 1.3 10*3/uL (ref 1.0–3.6)
LYMPHS PCT: 6.7 %
MCH: 30 pg (ref 26.0–34.0)
MCHC: 33 g/dL (ref 32.0–36.0)
MCV: 91 fL (ref 80–100)
Monocyte #: 1.6 x10 3/mm — ABNORMAL HIGH (ref 0.2–0.9)
Monocyte %: 8.1 %
NEUTROS PCT: 84.8 %
Neutrophil #: 16.7 10*3/uL — ABNORMAL HIGH (ref 1.4–6.5)
Platelet: 196 10*3/uL (ref 150–440)
RBC: 4.24 10*6/uL (ref 3.80–5.20)
RDW: 14.7 % — AB (ref 11.5–14.5)
WBC: 19.7 10*3/uL — ABNORMAL HIGH (ref 3.6–11.0)

## 2014-06-26 LAB — TROPONIN I: Troponin-I: 0.04 ng/mL

## 2014-06-27 LAB — CBC WITH DIFFERENTIAL/PLATELET
BASOS ABS: 0.1 10*3/uL (ref 0.0–0.1)
Basophil %: 0.5 %
EOS ABS: 0.1 10*3/uL (ref 0.0–0.7)
EOS PCT: 0.5 %
HCT: 41.6 % (ref 35.0–47.0)
HGB: 13.9 g/dL (ref 12.0–16.0)
LYMPHS PCT: 8.9 %
Lymphocyte #: 1.4 10*3/uL (ref 1.0–3.6)
MCH: 30 pg (ref 26.0–34.0)
MCHC: 33.4 g/dL (ref 32.0–36.0)
MCV: 90 fL (ref 80–100)
MONOS PCT: 7.6 %
Monocyte #: 1.2 x10 3/mm — ABNORMAL HIGH (ref 0.2–0.9)
Neutrophil #: 13.2 10*3/uL — ABNORMAL HIGH (ref 1.4–6.5)
Neutrophil %: 82.5 %
Platelet: 228 10*3/uL (ref 150–440)
RBC: 4.64 10*6/uL (ref 3.80–5.20)
RDW: 15.1 % — AB (ref 11.5–14.5)
WBC: 16 10*3/uL — ABNORMAL HIGH (ref 3.6–11.0)

## 2014-06-27 LAB — BASIC METABOLIC PANEL
ANION GAP: 5 — AB (ref 7–16)
BUN: 9 mg/dL (ref 7–18)
CHLORIDE: 105 mmol/L (ref 98–107)
Calcium, Total: 7.8 mg/dL — ABNORMAL LOW (ref 8.5–10.1)
Co2: 29 mmol/L (ref 21–32)
Creatinine: 0.73 mg/dL (ref 0.60–1.30)
EGFR (Non-African Amer.): >60
Glucose: 91 mg/dL (ref 65–99)
OSMOLALITY: 276 (ref 275–301)
Potassium: 2.9 mmol/L — ABNORMAL LOW (ref 3.5–5.1)
Sodium: 139 mmol/L (ref 136–145)

## 2014-06-27 LAB — MAGNESIUM: Magnesium: 2 mg/dL

## 2014-06-28 LAB — CBC WITH DIFFERENTIAL/PLATELET
BASOS ABS: 0.1 10*3/uL (ref 0.0–0.1)
Basophil %: 0.5 %
Eosinophil #: 0.1 10*3/uL (ref 0.0–0.7)
Eosinophil %: 1.1 %
HCT: 37.9 % (ref 35.0–47.0)
HGB: 12.5 g/dL (ref 12.0–16.0)
LYMPHS PCT: 10.8 %
Lymphocyte #: 1.3 10*3/uL (ref 1.0–3.6)
MCH: 29.8 pg (ref 26.0–34.0)
MCHC: 32.9 g/dL (ref 32.0–36.0)
MCV: 90 fL (ref 80–100)
MONOS PCT: 9.6 %
Monocyte #: 1.2 x10 3/mm — ABNORMAL HIGH (ref 0.2–0.9)
Neutrophil #: 9.4 10*3/uL — ABNORMAL HIGH (ref 1.4–6.5)
Neutrophil %: 78 %
Platelet: 234 10*3/uL (ref 150–440)
RBC: 4.2 10*6/uL (ref 3.80–5.20)
RDW: 14.7 % — AB (ref 11.5–14.5)
WBC: 12 10*3/uL — AB (ref 3.6–11.0)

## 2014-06-28 LAB — BASIC METABOLIC PANEL
Anion Gap: 6 — ABNORMAL LOW (ref 7–16)
BUN: 12 mg/dL (ref 7–18)
CALCIUM: 8.1 mg/dL — AB (ref 8.5–10.1)
CHLORIDE: 107 mmol/L (ref 98–107)
CO2: 28 mmol/L (ref 21–32)
Creatinine: 0.79 mg/dL (ref 0.60–1.30)
EGFR (African American): >60
EGFR (Non-African Amer.): >60
Glucose: 95 mg/dL (ref 65–99)
Osmolality: 281 (ref 275–301)
POTASSIUM: 3.9 mmol/L (ref 3.5–5.1)
SODIUM: 141 mmol/L (ref 136–145)

## 2014-06-29 LAB — CBC WITH DIFFERENTIAL/PLATELET
Basophil #: 0 10*3/uL (ref 0.0–0.1)
Basophil %: 0.5 %
EOS PCT: 1.5 %
Eosinophil #: 0.1 10*3/uL (ref 0.0–0.7)
HCT: 39.8 % (ref 35.0–47.0)
HGB: 13.3 g/dL (ref 12.0–16.0)
LYMPHS PCT: 11.8 %
Lymphocyte #: 1.1 10*3/uL (ref 1.0–3.6)
MCH: 29.7 pg (ref 26.0–34.0)
MCHC: 33.3 g/dL (ref 32.0–36.0)
MCV: 89 fL (ref 80–100)
MONO ABS: 1 x10 3/mm — AB (ref 0.2–0.9)
Monocyte %: 11.4 %
NEUTROS PCT: 74.8 %
Neutrophil #: 6.9 10*3/uL — ABNORMAL HIGH (ref 1.4–6.5)
PLATELETS: 261 10*3/uL (ref 150–440)
RBC: 4.46 10*6/uL (ref 3.80–5.20)
RDW: 14.9 % — ABNORMAL HIGH (ref 11.5–14.5)
WBC: 9.2 10*3/uL (ref 3.6–11.0)

## 2014-06-29 LAB — EXPECTORATED SPUTUM ASSESSMENT W GRAM STAIN, RFLX TO RESP C

## 2014-06-29 LAB — EXPECTORATED SPUTUM ASSESSMENT W REFEX TO RESP CULTURE

## 2014-06-30 LAB — CBC WITH DIFFERENTIAL/PLATELET
Basophil #: 0.1 10*3/uL (ref 0.0–0.1)
Basophil %: 0.4 %
EOS PCT: 1.2 %
Eosinophil #: 0.2 10*3/uL (ref 0.0–0.7)
HCT: 42.4 % (ref 35.0–47.0)
HGB: 13.8 g/dL (ref 12.0–16.0)
Lymphocyte #: 1.4 10*3/uL (ref 1.0–3.6)
Lymphocyte %: 10.8 %
MCH: 29 pg (ref 26.0–34.0)
MCHC: 32.6 g/dL (ref 32.0–36.0)
MCV: 89 fL (ref 80–100)
Monocyte #: 1.4 x10 3/mm — ABNORMAL HIGH (ref 0.2–0.9)
Monocyte %: 11.2 %
NEUTROS PCT: 76.4 %
Neutrophil #: 9.6 10*3/uL — ABNORMAL HIGH (ref 1.4–6.5)
Platelet: 299 10*3/uL (ref 150–440)
RBC: 4.76 10*6/uL (ref 3.80–5.20)
RDW: 14.8 % — AB (ref 11.5–14.5)
WBC: 12.5 10*3/uL — AB (ref 3.6–11.0)

## 2014-06-30 LAB — CULTURE, BLOOD (SINGLE)

## 2014-07-01 LAB — BASIC METABOLIC PANEL
Anion Gap: 8 (ref 7–16)
BUN: 15 mg/dL (ref 7–18)
Calcium, Total: 9 mg/dL (ref 8.5–10.1)
Chloride: 105 mmol/L (ref 98–107)
Co2: 29 mmol/L (ref 21–32)
Creatinine: 0.67 mg/dL (ref 0.60–1.30)
EGFR (Non-African Amer.): >60
Glucose: 117 mg/dL — ABNORMAL HIGH (ref 65–99)
Osmolality: 285 (ref 275–301)
POTASSIUM: 3.7 mmol/L (ref 3.5–5.1)
Sodium: 142 mmol/L (ref 136–145)

## 2014-07-02 LAB — CBC WITH DIFFERENTIAL/PLATELET
BASOS ABS: 0 10*3/uL (ref 0.0–0.1)
Basophil %: 0.1 %
Eosinophil #: 0 10*3/uL (ref 0.0–0.7)
Eosinophil %: 0.1 %
HCT: 41.8 % (ref 35.0–47.0)
HGB: 13.7 g/dL (ref 12.0–16.0)
Lymphocyte #: 1.8 10*3/uL (ref 1.0–3.6)
Lymphocyte %: 8.9 %
MCH: 29.3 pg (ref 26.0–34.0)
MCHC: 32.8 g/dL (ref 32.0–36.0)
MCV: 89 fL (ref 80–100)
MONO ABS: 1.1 x10 3/mm — AB (ref 0.2–0.9)
MONOS PCT: 5.4 %
Neutrophil #: 17.3 10*3/uL — ABNORMAL HIGH (ref 1.4–6.5)
Neutrophil %: 85.5 %
PLATELETS: 353 10*3/uL (ref 150–440)
RBC: 4.68 10*6/uL (ref 3.80–5.20)
RDW: 14.6 % — ABNORMAL HIGH (ref 11.5–14.5)
WBC: 20.2 10*3/uL — AB (ref 3.6–11.0)

## 2014-07-02 LAB — BASIC METABOLIC PANEL
Anion Gap: 7 (ref 7–16)
BUN: 20 mg/dL — ABNORMAL HIGH (ref 7–18)
CALCIUM: 8.6 mg/dL (ref 8.5–10.1)
CHLORIDE: 105 mmol/L (ref 98–107)
CO2: 29 mmol/L (ref 21–32)
Creatinine: 0.61 mg/dL (ref 0.60–1.30)
EGFR (African American): >60
GLUCOSE: 101 mg/dL — AB (ref 65–99)
Osmolality: 284 (ref 275–301)
POTASSIUM: 3.8 mmol/L (ref 3.5–5.1)
Sodium: 141 mmol/L (ref 136–145)

## 2014-10-18 NOTE — H&P (Signed)
PATIENT NAME:  Mindy Garza, Mindy Garza MR#:  [id number] DATE OF BIRTH:  June 05, 1926  DATE OF ADMISSION:  01/23/2013  PRIMARY CARE PHYSICIAN: Dr. Doy Hutching.  REFERRING PHYSICIAN:  Dr. Randie Heinz.  CHIEF COMPLAINT: A-fib, with RVR today.   HISTORY OF PRESENT ILLNESS: An 81 year old Caucasian female with a history of colon polyps, osteoporosis, breast cancer, was sent to the ED from her clinic due to a-fib with RVR today. After the patient came to [facility] for evaluation or colon polyps. The patient denies any symptoms, but she was noticed to have a-fib with RVR to 130s. The patient was treated with Cardizem 10 mg IV in the ED, but still has tachycardia, so the patient is being admitted for a-fib with RVR.   The patient denies any headache, dizziness. No chest pain, palpitation, orthopnea, or nocturnal dyspnea. No leg edema.   PAST MEDICAL HISTORY: Colon polyps, osteoporosis, breast cancer status post mastectomy.   PAST SURGICAL HISTORY: Right breast cancer mastectomy.   SOCIAL HISTORY: Quit smoking a long time ago. No alcohol drinking or illicit drugs.   FAMILY HISTORY: No hypertension, diabetes, heart attack or stroke.   ALLERGIES: NONE.   HOME MEDICATIONS: Vitamin D3 1000 international units, 1 capsule once a day, vitamin B12 1000/mL, 1 mL IM once a month, Nasacort AQ, 55 mcg 2 sprays once a day, multivitamin 1 capsule once a day convict, Combigan 0.2 to 0.5% ophthalmic solution, one drop twice a day, calcium/vitamin D 500 mg/200 international units, 2 tablets once a day, Ativan 1 mg p.o. once a day at bedtime.   REVIEW OF SYSTEMS:   CONSTITUTIONAL: The patient denies any fever or chills. No headache or dizziness. No weakness or weight loss.  EYES: No double vision or blurred vision.  ENT: No postnasal drip, slurred speech or dysphagia.  CARDIOVASCULAR: No chest pain, palpitation, orthopnea, or nocturnal dyspnea. No leg edema.  PULMONARY: No cough, sputum, shortness of breath or hemoptysis.   GASTROINTESTINAL: No abdominal pain, nausea, vomiting or diarrhea. No melena or bloody stool.  GENITOURINARY: No dysuria, hematuria, or incontinence.  SKIN: No rash or jaundice.  NEUROLOGIC: No syncope, loss of consciousness. No seizure.  HEMATOLOGY: No easy bruising, bleeding.  ENDOCRINE: No polyuria, polydipsia, heat or cold intolerance.   PHYSICAL EXAMINATION: VITAL SIGNS: Temperature 98.2, blood pressure 116/59, pulse 109, oxygen saturation 96% on room air.  GENERAL: The patient is alert, awake, oriented, in no acute distress.  HEENT: Pupils round, equal and reactive to light and accommodation.  NECK: Supple. No JVD or carotid bruits. No lymphadenopathy. No thyromegaly. Moist oral mucosa. Clear oropharynx.  CARDIOVASCULAR: S1, S2. Regular rate and rhythm. No murmurs, gallops.  PULMONARY: Bilateral air entry. No wheezing. No rales. No use of accessory muscles to breathe.  ABDOMEN: Soft. No distention. No tenderness. No organomegaly. Bowel sounds present.  EXTREMITIES: No edema, clubbing or cyanosis. No calf tenderness. Strong bilateral pedal pulses.  SKIN: No rash or jaundice.  NEUROLOGY: AAO x 3. No focal deficits. Power 5/5. Sensation intact.   LABORATORY DATA: TSH 1.18. Troponin less than 0.02. Glucose 100, BUN 17, creatinine 0.8. Electrolytes are normal. CBC normal.   EKG showed atrial fibrillation with RVR at 137 and 112 BPM.   IMPRESSIONS: 1.  New-onset atrial fibrillation.  2.  Osteoporosis.  3.  Colon polyps. 4.  History of breast cancer, status post mastectomy.   PLAN OF TREATMENT: 1.  The patient will be placed for observation. We will start Cardizem CD 120 mg p.o. daily. Start Lovenox 1  mg/kg subcutaneous q.12 hours.  2.  Get an echocardiograph and cardiology consult from Dr. Ubaldo Glassing, and continue other home medications.  3.  GI prophylaxis.   I discussed the patient's condition and plan of treatment with the patient and the patient's daughter.   Time spent: About  forty-eight minutes    ____________________________ Demetrios Loll, MD qc:dm D: 01/23/2013 14:39:29 ET T: 01/23/2013 15:10:31 ET JOB#: [id number]  cc: Demetrios Loll, MD, <Dictator> Demetrios Loll MD ELECTRONICALLY SIGNED 01/24/2013 12:51

## 2014-10-18 NOTE — Consult Note (Signed)
Brief Consult Note: Diagnosis: AFlutter RVR/Pre-op.   Patient was seen by consultant.   Consult note dictated.   Recommend further assessment or treatment.   Orders entered.   Discussed with Attending MD.   Comments: IMP Aflutter Hxbreast Ca OA Colonic polyps Tachy . PLAN ROMI Tele Agree with Cardiazem or B-blocker Short acticing anticoug pre-op ECHO If ECHO ok and rate controlled she should be an acceptable surgical candidate.  Electronic Signatures: Lujean Amel D (MD)  (Signed 30-Jul-14 07:21)  Authored: Brief Consult Note   Last Updated: 30-Jul-14 07:21 by Yolonda Kida (MD)

## 2014-10-19 NOTE — H&P (Signed)
PATIENT NAME:  Mindy Garza, SKOCZYLAS MR#:  [id number] DATE OF BIRTH:  1926-03-26  DATE OF ADMISSION:  05/01/2014  PRIMARY CARE PROVIDER: Dr. Doy Hutching  EMERGENCY DEPARTMENT REFERRING PHYSICIAN: Dr. Reita Cliche.   CHIEF COMPLAINT: Altered mental status/atrial flutter with RVR, elevated WBC count, and acute kidney injury.   HISTORY OF PRESENT ILLNESS: The patient is an 81 year old white female with history of having atrial fibrillation, colonic polyps, osteoporosis, breast cancer who according to the daughter was not acting herself today. She missed her doctor's appointment which is very unusual for the patient. Her daughter stated that when she asked her what day it was, she was not able to tell her. The patient was brought to the ED and was noted to have a WBC count that was 25,000 without any source of infection. She also was noted to have atrial flutter with RVR and her troponin was slightly elevated. Therefore, we were asked to admit the patient. The patient reports that she felt dehydrated yesterday and mouth was dry but now she feels like she is fine. Denies any fevers, chills. No chest pains. No shortness of breath. No nausea or vomiting. No diarrhea. Has chronic pain in her back and other areas due to severe osteoarthritis.   PAST MEDICAL HISTORY: Significant for history of colonic polyps and osteoporosis. History of breast cancer status post mastectomy. History of atrial flutter status post cardioversion with recurrence.   PAST SURGICAL HISTORY: History of right breast mastectomy as well as lumpectomy, hysterectomy.   SOCIAL HISTORY: History of smoking, quit a long time ago. No alcohol or drug use, lives by herself.   FAMILY HISTORY: No hypertension, diabetes or coronary artery disease.   ALLERGIES: None.   MEDICATIONS: She is on lubricant eyedrops 1 drop to each affected eye b.i.d. as needed, Flonase 1 spray daily, Eliquis 5 mg 1 tab p.o. b.i.d., Combigan 1 drop to both eyes twice daily, Cardizem CD 180,  1 tab p.o. daily, Ativan 1 mg at bedtime.   REVIEW OF SYSTEMS:  CONSTITUTIONAL: Denies any fevers. Complains of fatigue, has weakness, chronic pain related to osteoarthritis. No weight loss. No weight gain.  EYES: No blurred or double vision. No pain. No redness. History of cataracts bilaterally with removal, history of glaucoma.  ENT: No tinnitus. No ear pain. No hearing loss. No seasonal or year-round allergies. No epistaxis. No nasal discharge. No difficulty swallowing.  RESPIRATORY: Denies any cough, wheezing, hemoptysis. No COPD.  CARDIOVASCULAR: Denies any chest pain, orthopnea, edema. Has a history of atrial fibrillation.  GASTROINTESTINAL: No nausea, vomiting, diarrhea. No abdominal pain. No hematemesis. No melena. No IBS. No jaundice.  GENITOURINARY: Denies any dysuria, hematuria, renal colic or frequency.  ENDOCRINE: Denies any polyuria, nocturia, or thyroid problems.  HEMATOLOGIC AND LYMPHATIC: Denies anemia, easy bruisability or bleeding.  SKIN: No acne. No rash.  MUSCULOSKELETAL: Has pain related to osteoarthritis.  NEUROLOGIC: No numbness, CVA, TIA or seizure.  PSYCHIATRIC: Does have some anxiety. No insomnia, ADD or OCD.   PHYSICAL EXAMINATION: VITAL SIGNS: Temperature 98.2, pulse 124, respirations 20, blood pressure 116/71.  GENERAL: The patient is a thin female in no acute distress.  HEENT: Head atraumatic, normocephalic. Pupils equally round, reactive to light and accommodation. There is no conjunctival pallor. No sclerae icterus. Nasal exam shows no drainage or ulceration. Oropharynx is clear without any exudate.  NECK:  Supple without thyromegaly. No carotid bruits.  CARDIOVASCULAR: Irregularly irregular rhythm. No murmurs, rubs, clicks, or gallops.  LUNGS: Clear to auscultation bilaterally without any rales,  rhonchi, wheezing.  ABDOMEN: Soft, nontender, nondistended. Positive bowel sounds x4.  EXTREMITIES: No clubbing, cyanosis, or edema.  SKIN: No rash.  LYMPH NODES:  Nonpalpable.  VASCULAR: Good DP, PT pulses.  PSYCHIATRIC: Not anxious or depressed.  NEUROLOGIC: Awake, alert, and oriented x3. No focal deficits.   LABORATORY DATA: Glucose 153, BUN 34, creatinine 1.49, sodium 140, potassium 3.8, chloride 101, CO2 of 29, calcium 9.0. Troponin 0.11. WBC 24.6, hemoglobin 15.6, platelet count 233,000. CT scan of the head showed no acute abnormality.   ASSESSMENT AND PLAN: The patient is an 81 year old white female with history of atrial fibrillation, osteoporosis, breast cancer, brought in by family due to altered mental status. The patient in the ED noted to have atrial flutter with rapid ventricular response, elevated troponin.   1. Acute encephalopathy, unclear cause,  now improved. Possibly due to some dehydration.  2. Atrial flutter and atrial fibrillation with rapid ventricular response. At this time, I will give her IV Cardizem p.r.n., IV digoxin x1. Continue her Eliquis and p.o. Cardizem. Already seen by cardiology who has decreased the Eliquis dose.  3. Leukocytosis. No source of infection identified. Urinalysis is negative. Chest x-ray is negative. Will follow blood cultures, empiric IV Rocephin. If still elevated, consider hematologic evaluation.  4. Elevated troponin, possibly due to demand ischemia. Will do aspirin, serial enzymes, echo and cardiology consult. 5. Osteoporosis. Continue treatment as previously.  6. Miscellaneous for deep vein thrombosis prophylaxis. The patient already on Eliquis.  TIME SPENT: 50 minutes.  ____________________________ Lafonda Mosses Posey Pronto, MD shp:dw D: 05/01/2014 19:40:10 ET T: 05/01/2014 19:53:03 ET JOB#: [id number]  cc: Onell Mcmath H. Posey Pronto, MD, <Dictator> Alric Seton MD ELECTRONICALLY SIGNED 05/15/2014 19:28

## 2014-10-19 NOTE — Discharge Summary (Signed)
PATIENT NAME:  Mindy Garza, ONTKO MR#:  [id number] DATE OF BIRTH:  10-03-1925  DATE OF ADMISSION:  05/02/2014 DATE OF DISCHARGE:  05/06/2014  TYPE OF DISCHARGE: The patient is transferred to a skilled nursing facility.   REASON FOR ADMISSION: Rapid atrial flutter.   HISTORY OF PRESENT ILLNESS: The patient is an 81 year old female who presented to the Emergency Room on 05/01/2014 with progressive weakness. In the Emergency Room, the patient was noted to be in rapid atrial flutter with an elevated white count. She was also found to have pneumonia. She was admitted for further evaluation.   PAST MEDICAL HISTORY: 1.  Osteoporosis.  2.  Breast cancer, status post mastectomy. 3.  Colonic polyps.  4.  History of atrial flutter status post conversion.  5.  Status post hysterectomy.   MEDICATIONS ON ADMISSION: Please see admission note.   ALLERGIES: No known drug allergies.   SOCIAL HISTORY: The patient quit smoking a long time ago. No history of alcohol abuse.   FAMILY HISTORY: Negative for hypertension, diabetes, or coronary artery disease.   REVIEW OF SYSTEMS: As per admission note.   PHYSICAL EXAMINATION: GENERAL: The patient was in no acute distress.  VITAL SIGNS: Remarkable for a blood pressure of 116/71 with a heart rate of 124 and temperature of 98.2.  HEENT: Unremarkable.  NECK: Supple without JVD.  LUNGS: Scattered rhonchi.  CARDIAC: Irregularly irregular rhythm.  ABDOMEN: Soft and nontender.  EXTREMITIES: Without edema.  NEUROLOGIC: Grossly nonfocal.   LABORATORY DATA: Revealed a white count of 24.6 with a BUN of 34 and a creatinine of 1.49.   HOSPITAL COURSE: The patient was admitted with relative hypotension with a leukocytosis and rapid atrial flutter. There was some concern of sepsis. Follow-up chest x-ray revealed pneumonia. She was initially noted to have a mildly elevated troponin. She was given IV Cardizem and IV digoxin with improvement of her rate. She was seen by  cardiology who felt that the troponin elevation was due to demand ischemia. She was maintained on p.o. Cardizem for rate control. She was placed on IV antibiotics for pneumonia. Her cultures remained negative. Her leukocytosis improved. Her blood pressure remained stable. Her heart rate improved. She was seen by physical therapy who recommended placement to a skilled nursing facility for rehab. Social work consult was obtained and the family agreed. The patient is now transferred to a skilled nursing facility for further care and rehabilitation.   DISCHARGE DIAGNOSES: 1.  Rapid atrial flutter with demand ischemia.  2.  Community-acquired pneumonia.  3.  Sepsis, ruled out.  4.  Anemia of chronic disease.  5.  Abnormal liver function tests.  6.  Osteoporosis.  7.  Breast cancer, status post mastectomy.   DISCHARGE MEDICATIONS: 1.  Aspirin 81 mg p.o. daily.  2.  Cardizem CD 180 mg p.o. daily.  3.  Flonase 2 puffs in each nostril daily.  4.  Ativan 1 mg p.o. at bedtime.  5.  Combigan eyedrops 1 drop in each eye b.i.d.  6.  Zofran 4 mg p.o. q. 4 hours p.r.n. nausea and vomiting.  7.  Eliquis 5 mg p.o. b.i.d.  8.  Ceftin 250 mg p.o. b.i.d. x10 days.  9.  Protonix 40 mg p.o. daily.   FOLLOW-UP PLANS AND APPOINTMENTS: The patient is a FULL code. She will be followed by the resident physician at the skilled nursing facility. She is on a low-fat, low-cholesterol diet. She will be seen in consultation by physical therapy. She will follow up with  me in the [facility] in 1 to 2 weeks, sooner if needed.  ____________________________ Leonie Douglas Doy Hutching, MD jds:sb D: 05/06/2014 06:54:08 ET T: 05/06/2014 07:11:15 ET JOB#: [id number]  cc: Leonie Douglas. Doy Hutching, MD, <Dictator> Shakora Nordquist Lennice Sites MD ELECTRONICALLY SIGNED 05/08/2014 12:13

## 2014-10-19 NOTE — H&P (Signed)
PATIENT NAME:  Mindy Garza, Mindy Garza MR#:  [id number] DATE OF BIRTH:  09-06-1925  DATE OF ADMISSION:  06/25/2014  REFERRING PHYSICIAN: Dr. Archie Balboa   PRIMARY CARE DOCTOR: Leonie Douglas. Doy Hutching, MD, of [facility].   ADMITTING PHYSICIAN: Juluis Mire, MD   CHIEF COMPLAINT:  1.  Generalized weakness.  2.  Cough for the past 3 days.    HISTORY OF PRESENT ILLNESS: An 81 year old Caucasian female with a past medical history significant for atrial flutter, history of osteoporosis and history of breast cancer, status post mastectomy, who was brought to the complaints of generalized weakness and cough ongoing for the past 2-3 days. According to the patient's daughter, who is with the patient at this time, the patient has been having some generalized weakness and cough for the past 3 days; hence, was brought to the Emergency Room for further evaluation. There was no fever. She does have some sputum with cough. No chest pain. No shortness of breath. No history of any nausea, vomiting, diarrhea, abdominal pain. No urinary symptoms. In the Emergency Room, the patient was evaluated by the ED physician and was found to have elevated white blood cell count of 25,000 and a chest x-ray significant for a right upper lobe pneumonia. The patient was also noted to have atrial flutter with rapid ventricular rate. IV Cardizem was given, 1 dose and the patient was started on IV antibiotics after the pancultures. Of note, the patient was admitted in November to [facility] with similar complaints. At that time, she was also noted to have pneumonia and also she had atrial flutter with rapid ventricular rate. She was treated with  antibiotics and discharged. Since her discharge, she said she has been doing fairly well, according to the patient's daughter. In the Emergency Room, the patient continued to have rapid ventricular rates; hence, she was started on Cardizem drip and heart rate was brought into reasonable control. Currently her heart rate  is around 100.   DICTATION ENDS HERE.  Dictation line got cut off, so H`P redictated from the begining. Please see dictated H`P done the same day.   ____________________________ Juluis Mire, MD enr:bm D: 06/26/2014 02:23:58 ET T: 06/26/2014 03:09:08 ET JOB#: [id number]  cc: Juluis Mire, MD, <Dictator> Juluis Mire MD ELECTRONICALLY SIGNED 06/26/2014 11:25

## 2014-10-19 NOTE — Consult Note (Signed)
PATIENT NAME:  Mindy Garza, Mindy Garza MR#:  [id number] DATE OF BIRTH:  1926/02/17  DATE OF CONSULTATION:  05/01/2014  REFERRING PHYSICIAN:   CONSULTING PHYSICIAN:  Corey Skains, MD  CONSULTING PHYSICIAN:   Lisa Roca, MD  REASON FOR CONSULTATION: New onset atrial flutter with rapid ventricular rate, elevated troponin.   HISTORY OF PRESENT ILLNESS:  This is an 81 year old female with new onset atrial flutter with rapid ventricular rate. The patient has had no evidence of chest discomfort and/or other cardiovascular symptoms or heart failure. At this time the patient does have also an EKG showing atrial flutter with rapid ventricular rate and no other ST changes concerning. She does have an elevated troponin most consistent with demand ischemia and likely due to issues including a urinary tract infection, hypotension and weakness.  She does feel slightly better at this time without evidence of symptoms after some hydration.   REVIEW OF SYSTEMS:  The remainder review of systems cannot be assessed due to the patient's difficulty with conversation.   PAST MEDICAL HISTORY: 1.  Atrial flutter nonvalvular.  2.  Hypertension.   FAMILY HISTORY:  There are members of family history with hypertension but no evidence of significant coronary artery disease.   SOCIAL HISTORY:  Currently denies alcohol or tobacco use.   ALLERGIES: As listed.   MEDICATIONS: As listed.   PHYSICAL EXAMINATION: VITAL SIGNS: Blood pressure is 110/62 bilaterally. Heart rate is 122 upright and reclining regular.  GENERAL: She is a well-appearing female in no acute distress.  HEENT: No icterus, thyromegaly, ulcers, hemorrhage or xanthelasma.  CARDIOVASCULAR: Regular rate and rhythm. Normal S1 and S2 without murmur, gallop or rub. PMI is inferiorly displaced. Carotid upstroke normal without bruit. Jugular venous pressure is normal.  LUNGS:  Clear to auscultation with normal respirations.  ABDOMEN: Soft, nontender without  hepatosplenomegaly or masses. Abdominal aorta is normal size without bruit.  EXTREMITIES: Show 2+ radial, femoral, dorsal pedal pulses with no lower extremity edema, cyanosis, clubbing or ulcers.  NEUROLOGIC: She is oriented to time, place and person, with normal mood and affect.   ASSESSMENT:  An 81 year old female with new onset atrial flutter with rapid ventricular rate likely secondary to urinary tract infection  with elevated troponin consistent with demand ischemia and no current evidence of congestive heart failure or significant acute coronary syndrome.   RECOMMENDATIONS:   1.  Diltiazem orally as medication dose as outpatient at 240 mg for heart rate control of nonvalvular atrial flutter. 2.   No further intervention of elevated troponin, most consistent with demand ischemia.  3.  Continue anticoagulation with 2.5 mg of Eliquis twice per day for further risk reduction in stroke with atrial fibrillation and/or atrial flutter. 4.   Further treatment of current symptoms of urinary tract infection.    ____________________________ Corey Skains, MD bjk:jw D: 05/01/2014 17:51:32 ET T: 05/01/2014 18:58:50 ET JOB#: [id number]  cc: Corey Skains, MD, <Dictator> Corey Skains MD ELECTRONICALLY SIGNED 05/03/2014 7:52

## 2014-10-23 NOTE — Consult Note (Signed)
PATIENT NAME:  Mindy Garza, Mindy Garza MR#:  [id number] DATE OF BIRTH:  19-Oct-1925  DATE OF CONSULTATION:  06/26/2014  REFERRING PHYSICIAN:   CONSULTING PHYSICIAN:  Dwayne D. Clayborn Bigness, MD  PRIMARY CARE PHYSICIAN: Leonie Douglas. Doy Hutching, MD.  REFERRED BY: Dr. Reece Levy.   INDICATION FOR CONSULT: Atrial fibrillation and weakness.   HISTORY OF PRESENT ILLNESS: Ms. Bohlen is an 81 year old female with past medical history of atrial flutter, osteoporosis, breast cancer status post mastectomy, brought to the ER for complaints of generalized weakness, coughing over the last 2 to 3 days. According to her daughter, the patient's cough has gotten progressively, finally prompted to go for evaluation. Not much in the way shortness of breath. She has had no nausea, vomiting, diarrhea, abdominal pain. In the Emergency Room, she was found to have a white count of 25,000, x-ray revealing right upper lobe pneumonia. The patient was also found to be in atrial flutter with rapid ventricular rate, placed on Cardizem and also started on an antibiotic. The patient was admitted back in November with similar complaints. Also noted to have pneumonia and atrial flutter with rapid rate. She was treated with antibiotics and discharged. The patient states she never quite got over it and presents now with similar complaints of pneumonia and rapid atrial fibrillation/flutter.  PAST MEDICAL HISTORY: Atrial flutter, atrial fibrillation, osteoporosis, breast cancer, pneumonia, weakness, fatigue.  PAST SURGICAL HISTORY: Right mastectomy, hysterectomy, bilateral cataract surgery.   ALLERGIES: None.   MEDICATIONS: Eliquis 5 mg twice a day, aspirin 81 mg a day, Combigan eye drops at night, diltiazem 180 mg once a day, fluticasone  spray twice a day , lorazepam 1 mg at bedtime as needed.   SOCIAL HISTORY: Lives at home with her son. History of smoking, but quit a long time ago. No alcohol consumption or substance abuse.   FAMILY HISTORY: No  hypertension, diabetes, or coronary disease.   REVIEW OF SYSTEMS: No blackout spells or syncope. Denies nausea or vomiting. No significant fever, chills, sweats. She has had a cough. No weight loss. No weight gain. No  No vision change or hearing change. . She has had significant cough.  PHYSICAL EXAMINATION:  VITAL SIGNS: Blood pressure was 125/76, pulse of 130 and irregular, respiratory rate of 20, afebrile.  HEENT: Normocephalic, atraumatic. Pupils equal and reactive to light.  NECK: Supple. No significant JVD, bruits or adenopathy.  LUNGS: Have bilateral rhonchi. Decreased breath sounds in the right side, upper. No wheezing. No rales.  HEART: Tachycardic, irregular. Systolic ejection murmur at the apex.  ABDOMEN: Benign.  EXTREMITY: Within normal limits.  NEUROLOGIC:  Intact.   LABORATORIES: Glucose 117, BUN 12, creatinine 0.75, sodium 137, potassium 3.4, chloride 104, bicarbonate 25, calcium 8.4. Troponin 0.04, glucose  , white count of 25, hemoglobin 13.9, hematocrit 47, platelet count of 330,000.   UA shows white cells, 1+ bacteria.   Chest x-ray: Right upper lobe pneumonia.   EKG: Atrial flutter, rate of 130, nonspecific ST-T wave changes.   ASSESSMENT: Pneumonia, elevated white count, atrial flutter, rapid ventricular response, coagulopathy secondary to Eliquis, positive for onset of  weakness and fatigue.   PLAN:  1. Agree with admit for pneumonia. Continue oxygen therapy. Continue antibiotic therapy. Consider pulmonary input. Consider CT of the chest to be sure this is not a postobstructive pneumonia. Broad-spectrum community-acquired antibiotic therapy, and see how the patient responds.  2. Atrial flutter, continue rate control. Continue anticoagulation. May need rhythm control, as well. Consider treatment with amiodarone and/or sotalol. 3. Continue  Eliquis therapy for anticoagulation.  4. Osteoporosis therapy if patient becomes stable.  5. Generalized weakness may be related  to pneumonia.  6. Consider physical therapy to help with strength and gait training. 7. Continue GI prophylaxis.  8. DVT prophylaxis is already covered with Eliquis.   We will continue to  follow the patient without direct cardiac intervention except for rate control for atrial flutter at this point, either with increase in diltiazem or consider adding digoxin to help with rate or low-dose beta-blockade.  We will continue to follow the patient with you.    ____________________________ Loran Senters. Clayborn Bigness, MD ddc:JT D: 06/27/2014 09:22:12 ET T: 06/27/2014 10:41:12 ET JOB#: [id number]  cc: Dwayne D. Clayborn Bigness, MD, <Dictator> Yolonda Kida MD ELECTRONICALLY SIGNED 07/18/2014 11:44

## 2014-10-23 NOTE — H&P (Signed)
PATIENT NAME:  Mindy Garza, Mindy Garza MR#:  [id number] DATE OF BIRTH:  1926-04-17  DATE OF ADMISSION:  06/25/2014  REFERRING PHYSICIAN:  Debbrah Alar, MD  PRIMARY CARE PHYSICIAN:  Leonie Douglas. Doy Hutching, MD, of [facility] clinic.   ADMITTING PHYSICIAN:  Juluis Mire, MD   Of note, I was dictating the H and P before and it got disconnected, so please disregard the earlier truncated H and P.   CHIEF COMPLAINT: 1.  Generalized weakness.  2.  Cough with expectoration for the past 3 days.   HISTORY OF PRESENT ILLNESS:  This is an 81 year old Caucasian female with history of atrial flutter, osteoporosis and breast cancer status post mastectomy, was brought to the Emergency Room with the complaints of generalized weakness associated with cough and productive sputum ongoing for the past 3 days. According to the patient's daughter, who is with the patient at this time, the patient has been having some generalized weakness and having cough with productive sputum for the past 3 days. No history of any fever. No shortness of breath. No chest pain. No nausea. No vomiting. No diarrhea. No urinary symptoms. In the Emergency Room, the patient was evaluated by the ED physician and was found to have elevated white blood cell count of 25,000 and a chest x-ray was significant for right upper lobe pneumonia. The patient was also noted to have atrial flutter with rapid ventricular rate in the Emergency Room. For her pneumonia, after blood cultures were obtained, the patient was started on IV antibiotics. Of note, the patient was admitted last month with a similar complaint, during which time she was treated for pneumonia and atrial flutter with rapid ventricular rate. Hence, she was started on IV Levaquin and Zosyn and vancomycin in view of hospital-acquired pneumonia. The patient was noted to have atrial flutter with rapid ventricular rate. Hence, Cardizem IV was given but continued to have a rapid ventricular rate. Hence, a Cardizem  drip was started and was continued in the Emergency Room. With the Cardizem drip, her heart rate is in reasonable control and currently at around 100 per minute and the patient remains stable.   PAST MEDICAL HISTORY: 1.  Atrial flutter.  2.  Osteoporosis.  3.  Breast cancer status post mastectomy.   PAST SURGICAL HISTORY: 1.  Status post right mastectomy.  2.  Status post hysterectomy.  3.  Status post bilateral cataract surgery.   ALLERGIES:  No known drug allergies.   HOME MEDICATIONS:   1.  Eliquis 5 mg tablet 1 tablet orally twice a day. 2.  Aspirin 81 mg tablet 1 tablet orally once a day.  3.  Combigan 0.2/0.5 ophthalmic drops 1 drop every night.  4.  Diltiazem 180 mg per 24 hours oral capsule 1 capsule orally once a day.  5.  Fluticasone nasal spray 2 sprays in each nostril once a day as needed.  6.  Lorazepam 1 mg tablet orally once a day at bedtime as needed.   SOCIAL HISTORY:  She lives at home and son lives with her. History of smoking in the past, quit a long time ago, and denies any history of alcohol or substance abuse.   FAMILY HISTORY:  No history of hypertension, diabetes, or coronary artery disease.   REVIEW OF SYSTEMS: CONSTITUTIONAL:  Negative for fever or chills. Positive for generalized weakness.  EYES:  Negative for blurred vision or double vision. No pain. No redness. No inflammation.  EARS, NOSE, AND THROAT:  Negative for tinnitus, ear  pain, or hearing loss.  RESPIRATORY:  Positive for cough with productive sputum. Negative for wheezing. No hemoptysis. No dyspnea. No painful respirations.  CARDIOVASCULAR:  Negative for chest pain, orthopnea, pedal edema, palpitations, dizziness, or syncope.  GASTROINTESTINAL:  Negative for nausea, vomiting, diarrhea, abdominal pain, GERD symptoms, hematemesis, or melena.  GENITOURINARY:  Negative for dysuria, hematuria, or frequency.  ENDOCRINE:  Negative for polyuria or nocturia. No heat or cold intolerance.  HEMATOLOGIC:   Negative for anemia or easy bruising or bleeding.  INTEGUMENTARY:  Negative for acne, skin rash, or lesions.  MUSCULOSKELETAL:  Generalized some aches and pains, otherwise negative for any arthritis.  NEUROLOGICAL:  Negative for focal weakness or numbness. No history of CVA, TIA, or seizure disorder.  PSYCHIATRIC:  Negative for anxiety, insomnia, or depression.   PHYSICAL EXAMINATION: VITAL SIGNS:  On arrival, temperature 98.6 degrees Fahrenheit, pulse rate 129 per minute, respirations 24, blood pressure 111/67; oxygen saturation 92% on room air.  GENERAL:  Elderly female, alert, awake, and oriented, mildly distressed because of the cough.  HEAD:  Atraumatic, normocephalic.  EYES:  Pupils are equal and react to light. No conjunctival pallor. No scleral icterus. Extraocular movements are intact.  NOSE:  No nasal lesions. No drainage. EARS:  No drainage. No external lesions.  ORAL CAVITY:  No mucosal lesions. No exudates.  NECK:  Supple. No JVD. No thyromegaly. No carotid bruit. Range of motion of the neck is normal.  RESPIRATORY:  Bilateral air entry present. Not using accessory muscles of respiration. Diminished air entry at both bases present. A few rales at the bases.  CARDIOVASCULAR:  S1, S2 regular. Tachycardia present. Peripheral pulses equal at carotid, femoral, and pedal pulses. No peripheral edema.  GASTROINTESTINAL:  Abdomen is soft and nontender. No hepatosplenomegaly. Bowel sounds present and equal in all 4 quadrants. No tenderness. No guarding. No rigidity.  GENITOURINARY:  Deferred.  MUSCULOSKELETAL:  Gait not tested. Range of motion adequate in all areas. Strength and tone equal bilaterally.  SKIN:  Inspection within normal limits.  LYMPHATIC:  No cervical lymphadenopathy.  VASCULAR:  Good dorsalis pedis and posterior pulses.  NEUROLOGICAL:  Alert, awake, and oriented x 3. Cranial nerves II through XII are grossly intact. Motor is 5/5 in both upper and lower extremities. DTRs are  2+ bilaterally and symmetrical.  PSYCHIATRIC:  Judgment and insight are adequate. Memory and mood are within normal limits.   LABORATORY DATA:  Serum glucose is 117, BUN 12, creatinine 0.75, serum sodium 137, potassium 3.4, chloride 104, bicarbonate 25, and total calcium is 8.4. Troponin is 0.04. WBC is 25.3, hemoglobin 13.9, hematocrit 42.7, and platelet count is 230,000. Urinalysis:  WBC 17 per high-power field, bacteria 1+.   IMAGING STUDIES:  Chest x-ray:  Mild patchy right upper lobe opacities suspicious for pneumonia.  EKG:  Atrial flutter with ventricular rate of 129 beats per minute. No acute ST-T changes.   ASSESSMENT AND PLAN:  This is an 81 year old Caucasian female with a past medical history of atrial flutter, history of osteoporosis and breast cancer status post mastectomy, who presents with the complaints of generalized weakness with cough with productive sputum of 2 to 3 days' duration. The patient was found to have elevated white blood cell count and chest x-ray significant for right upper lobe pneumonia and atrial flutter with a rapid ventricular rate.   1.  Right upper lobe pneumonia, hospital acquired.  Plan:  Admit to medicine. Blood and sputum cultures obtained. Continue IV antibiotics Levaquin, Zosyn, and vancomycin in  view of her recent admission last month with the same problem. Oxygen supplementation.  2.  Atrial flutter with rapid ventricular rate. Troponin x 1 negative. The patient was started on a Cardizem drip, following which her rate is under reasonable control. Continue p.o. Cardizem for now.  3.  Chronic anticoagulation. The patient is on Eliquis. Stable. Continue same.  4.  History of osteoporosis, stable clinically. Continue same.  5.  Deep vein thrombosis prophylaxis. The patient is on Eliquis. Continue same.  6.  Gastrointestinal prophylaxis with Protonix.   CODE STATUS:  Full code.   TIME SPENT:  55 minutes.   ____________________________ Juluis Mire, MD enr:nb D: 06/26/2014 02:36:25 ET T: 06/26/2014 03:04:38 ET JOB#: [id number]  cc: Juluis Mire, MD, <Dictator> Leonie Douglas. Doy Hutching, MD Juluis Mire MD ELECTRONICALLY SIGNED 06/28/2014 18:41

## 2014-10-27 NOTE — Discharge Summary (Signed)
PATIENT NAME:  Mindy Garza, Mindy Garza MR#:  [id number] DATE OF BIRTH:  08-08-1925  DATE OF ADMISSION:  06/26/2014 DATE OF DISCHARGE:  07/02/2014  TYPE OF DISCHARGE: The patient is transferred to a skilled nursing facility.   REASON FOR ADMISSION: Generalized weakness.   HISTORY OF PRESENT ILLNESS: The patient is an 81 year old female with a history of atrial flutter, breast cancer, and osteoporosis who presented to the Emergency Room with cough and weakness. Was found to be in rapid atrial flutter with pneumonia and was admitted for further evaluation. She was initially given IV Cardizem and started on IV antibiotics.  PAST MEDICAL HISTORY:  1.  Breast cancer status post mastectomy.  2.  Atrial flutter.  3.  Osteoporosis.  4.  Status post hysterectomy.   MEDICATIONS ON ADMISSION: Please see admission note.   ALLERGIES: No known drug allergies.   SOCIAL HISTORY, FAMILY HISTORY, AND REVIEW OF SYSTEMS: As per admission note.   PHYSICAL EXAMINATION: The patient was mildly distressed because of cough. Vital signs were remarkable only for a heart rate of 129. HEENT exam was unremarkable. Neck was supple without JVD. Lungs revealed decreased breath sounds with scattered rales. Cardiac exam with a rapid rate with a regular rhythm. Abdomen soft and nontender. Extremities without edema.  Neurologic exam was grossly nonfocal.   HOSPITAL COURSE: The patient was admitted with rapid atrial flutter and pneumonia. She was treated empirically with IV antibiotics. Blood culture negative. She was seen by cardiology. She was taken off her Cardizem drip and switched to oral Cardizem with improvement of her symptoms. The patient remained hypoxic, requiring oxygen. She was placed on steroids. She continued to improve slowly. She was weak. Physical therapy recommended skilled nursing placement for rehab. The patient agreed. She is now transferred to the skilled nursing facility for further care and rehabilitation.    DISCHARGE DIAGNOSES: 1.  Community-acquired pneumonia.  2.  Rapid atrial flutter.  3.  Osteoporosis.  4.  Breast cancer, status post mastectomy.   DISCHARGE MEDICATIONS: 1.  Oxygen at 3 liters per minute per nasal cannula.  2.  DuoNebs SVNs q.i.d.  3.  Eliquis 5 mg p.o. b.i.d.  4.  Aspirin 81 mg p.o. daily.  5.  Flonase 2 puffs in each nostril daily.  6.  Ativan 1 mg p.o. at bedtime.  7.  Protonix 40 mg p.o. daily.  8.  Colace 100 mg p.o. b.i.d.  9.  Combigan eyedrops 1 drop in each eye b.i.d.  10.  Hycodan 5 mL p.o. every 6 hours p.r.n. cough.  11.  Cardizem CD 300 mg p.o. daily.  12.  Klor-Con 40 mEq p.o. daily.  13.  Prednisone taper as directed.  14.  Levaquin 750 mg p.o. every 48 hours x5 doses.  15.  Zofran 4 mg p.o. every 6 hours p.r.n. nausea/vomiting.   FOLLOW-UP PLANS AND APPOINTMENTS: The patient is a FULL code. She will be followed by the resident physician at the skilled nursing facility. She is on a 2 gram sodium diet. She will be seen in consultation by physical therapy. Will obtain a CBC and a MET-B and a chest x-ray in 1 week.   ____________________________ Leonie Douglas. Doy Hutching, MD jds:sb D: 07/02/2014 07:07:54 ET T: 07/02/2014 07:36:50 ET JOB#: [id number]  cc: Leonie Douglas. Doy Hutching, MD, <Dictator> Mosie Angus Lennice Sites MD ELECTRONICALLY SIGNED 07/03/2014 7:44

## 2014-11-04 ENCOUNTER — Other Ambulatory Visit: Payer: Self-pay

## 2014-11-04 DIAGNOSIS — Z1231 Encounter for screening mammogram for malignant neoplasm of breast: Secondary | ICD-10-CM

## 2014-12-27 ENCOUNTER — Ambulatory Visit: Payer: Medicare Other

## 2015-01-02 ENCOUNTER — Ambulatory Visit: Payer: Medicare Other | Admitting: General Surgery

## 2015-02-06 ENCOUNTER — Encounter: Payer: Self-pay | Admitting: *Deleted

## 2015-09-20 IMAGING — CR DG CHEST 2V
1 series · 2 of 2 positions shown · non-contrast
Comparison: Portable chest x-ray May 01, 2014

CLINICAL DATA: Back pain and shortness of breath with history of
breast malignancy.

EXAM:
CHEST  2 VIEW

[Series 1: x chest ap · 0.14mm/px · 2 of 2 slices shown]
[im 1/2]
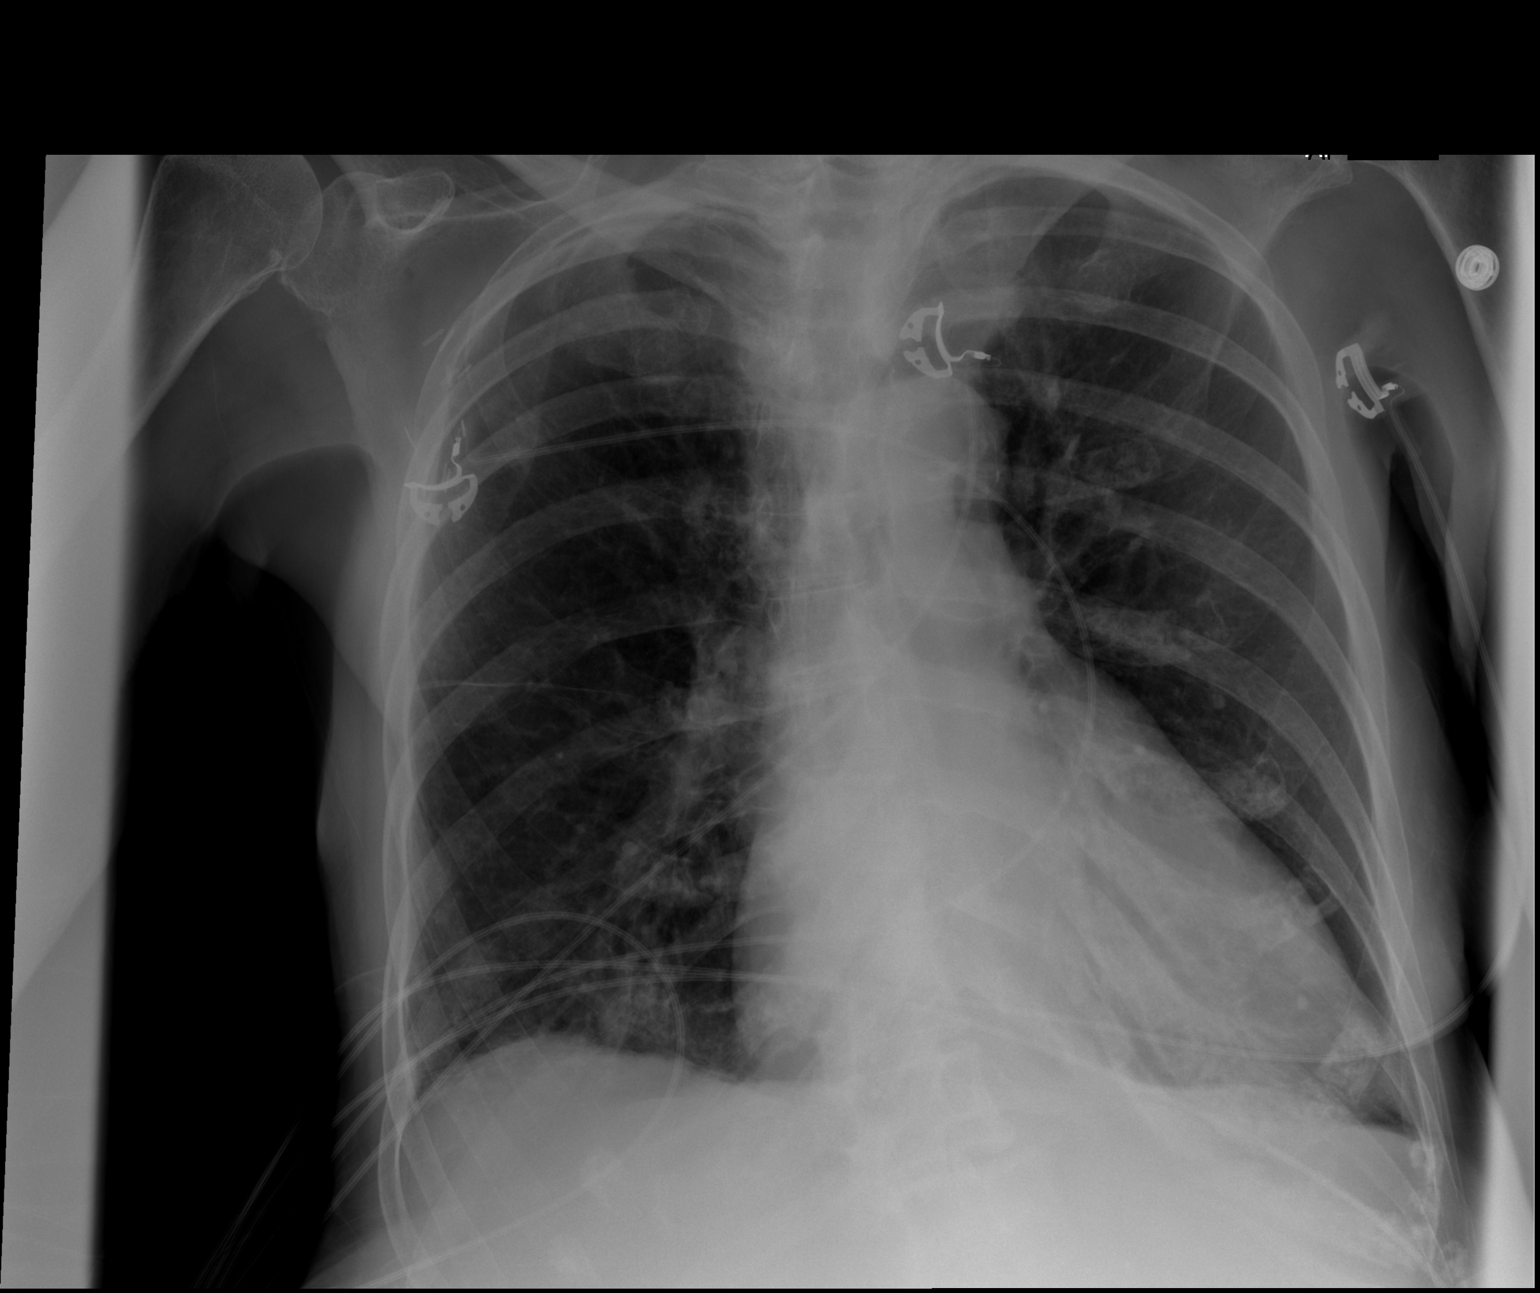
[im 2/2]
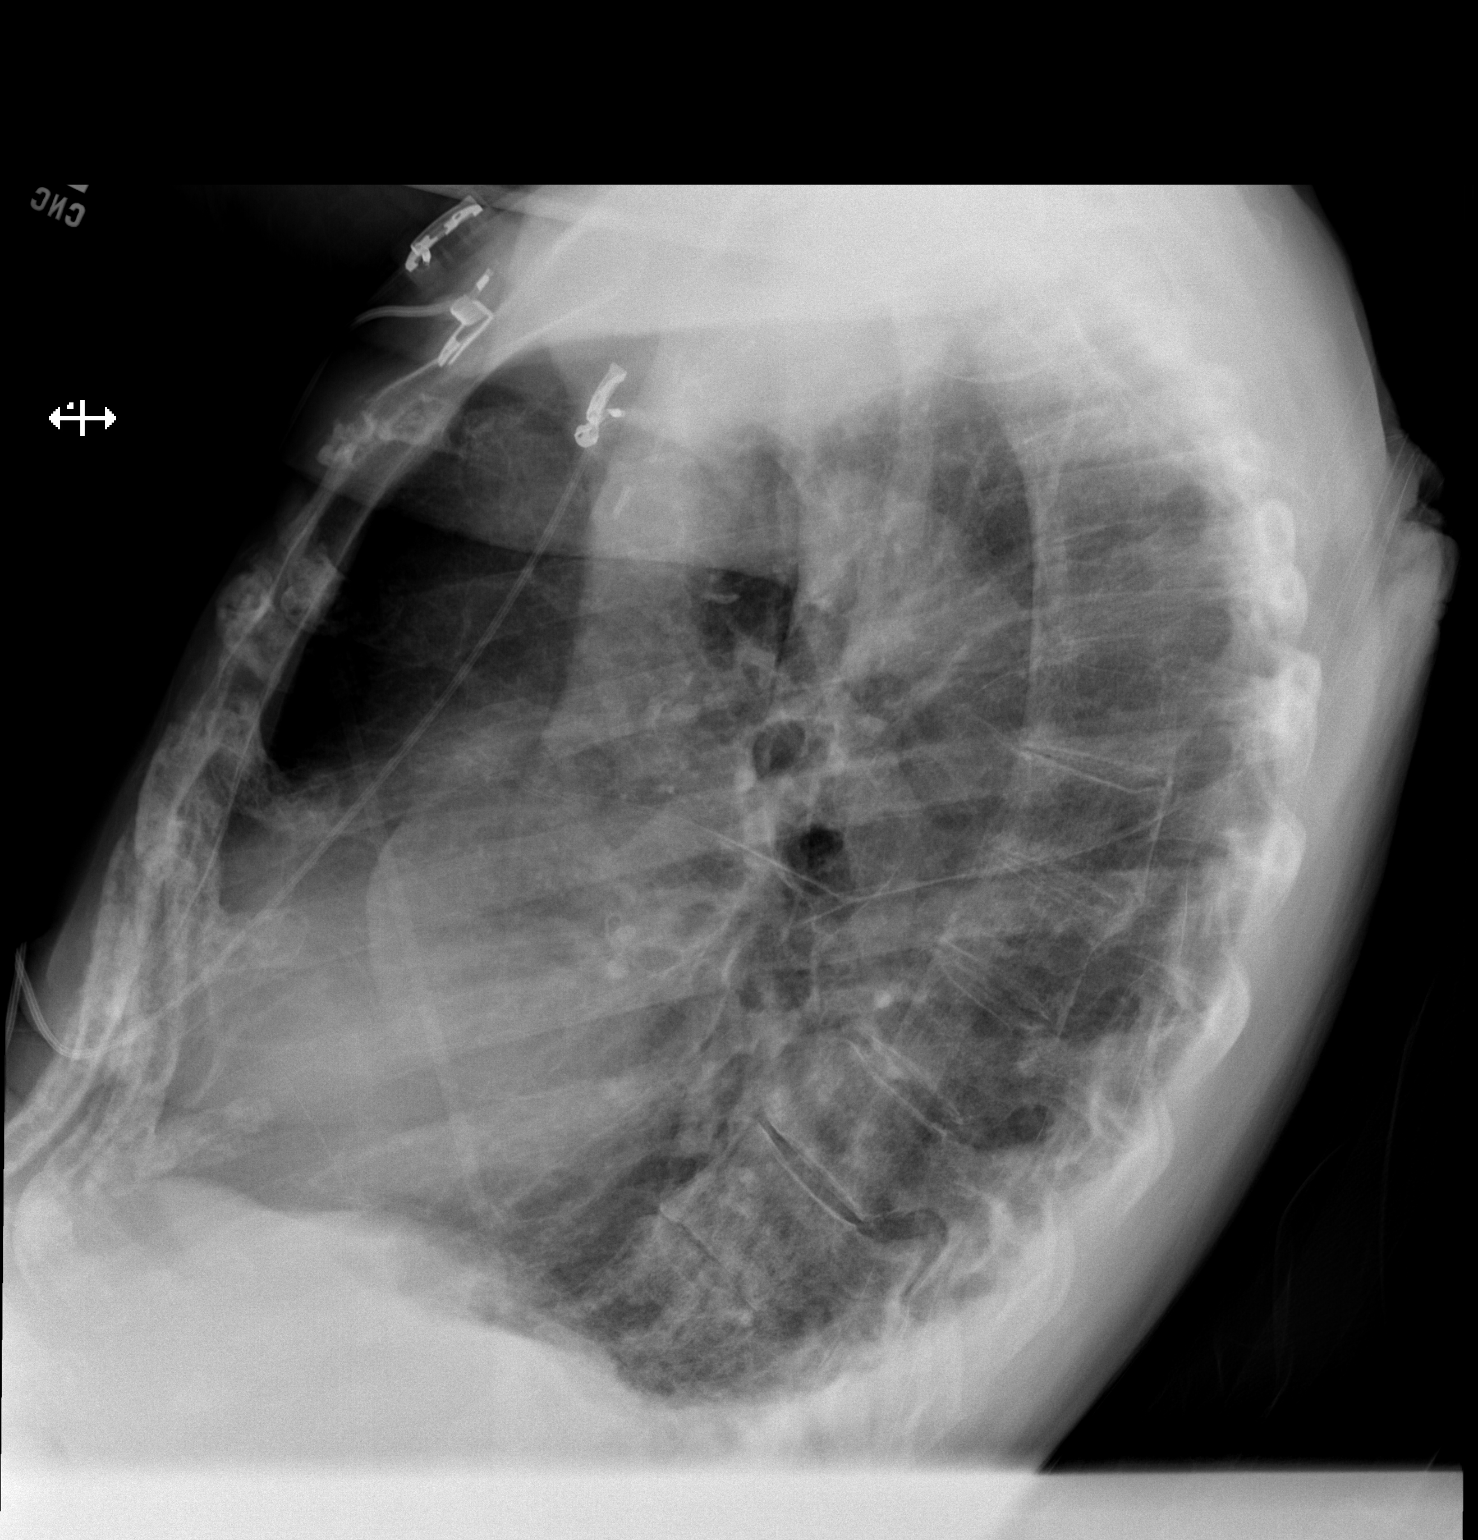

[2 of 2 positions shown; findings below may reference images not displayed]

FINDINGS: The lungs are mildly hyperinflated. There is blunting of the
posterior costophrenic angles consistent with small pleural
effusions. There are small amounts of increased density posteriorly
in both lower lobes. There is no alveolar pneumonia. The cardiac
silhouette is mildly enlarged though stable. The pulmonary
vascularity is not engorged. The trachea is midline. There are
surgical clips in the right axilla. The observed bony thorax is
unremarkable.
IMPRESSION: COPD.  Small bilateral pleural effusions with bibasilar atelectasis.

## 2017-12-16 ENCOUNTER — Other Ambulatory Visit: Payer: Self-pay | Admitting: Internal Medicine

## 2017-12-16 DIAGNOSIS — M7989 Other specified soft tissue disorders: Secondary | ICD-10-CM

## 2017-12-22 ENCOUNTER — Ambulatory Visit
Admission: RE | Admit: 2017-12-22 | Discharge: 2017-12-22 | Disposition: A | Discharge status: Home / Self Care | Payer: Medicare Other | Source: Ambulatory Visit | Attending: Internal Medicine | Admitting: Internal Medicine

## 2017-12-22 DIAGNOSIS — M7989 Other specified soft tissue disorders: Secondary | ICD-10-CM | POA: Insufficient documentation

## 2017-12-22 DIAGNOSIS — M7121 Synovial cyst of popliteal space [Baker], right knee: Secondary | ICD-10-CM | POA: Insufficient documentation

## 2019-10-25 ENCOUNTER — Other Ambulatory Visit: Payer: Self-pay

## 2019-10-25 ENCOUNTER — Emergency Department
Admission: EM | Admit: 2019-10-25 | Discharge: 2019-10-25 | Disposition: A | Discharge status: Home / Self Care | Payer: Medicare Other | Attending: Emergency Medicine | Admitting: Emergency Medicine

## 2019-10-25 ENCOUNTER — Encounter: Payer: Self-pay | Admitting: Emergency Medicine

## 2019-10-25 ENCOUNTER — Emergency Department: Payer: Medicare Other

## 2019-10-25 DIAGNOSIS — Y999 Unspecified external cause status: Secondary | ICD-10-CM | POA: Diagnosis not present

## 2019-10-25 DIAGNOSIS — Y93E2 Activity, laundry: Secondary | ICD-10-CM | POA: Diagnosis not present

## 2019-10-25 DIAGNOSIS — W1839XA Other fall on same level, initial encounter: Secondary | ICD-10-CM | POA: Diagnosis not present

## 2019-10-25 DIAGNOSIS — Z7901 Long term (current) use of anticoagulants: Secondary | ICD-10-CM | POA: Diagnosis not present

## 2019-10-25 DIAGNOSIS — Z853 Personal history of malignant neoplasm of breast: Secondary | ICD-10-CM | POA: Insufficient documentation

## 2019-10-25 DIAGNOSIS — W19XXXA Unspecified fall, initial encounter: Secondary | ICD-10-CM

## 2019-10-25 DIAGNOSIS — Z79899 Other long term (current) drug therapy: Secondary | ICD-10-CM | POA: Insufficient documentation

## 2019-10-25 DIAGNOSIS — S8992XA Unspecified injury of left lower leg, initial encounter: Secondary | ICD-10-CM

## 2019-10-25 DIAGNOSIS — Y929 Unspecified place or not applicable: Secondary | ICD-10-CM | POA: Insufficient documentation

## 2019-10-25 MED ORDER — ACETAMINOPHEN 325 MG PO TABS
650.0000 mg | ORAL_TABLET | Freq: Once | ORAL | Status: AC
  Administered 2019-10-25: 16:00:00 650 mg via ORAL
  Filled 2019-10-25: qty 2

## 2019-10-25 NOTE — TOC Transition Note (Signed)
Transition of Care [facility]) - CM/SW Discharge Note  Patient Details  Name: Mindy Garza MRN: [id number] Date of Birth: [birth year]  Transition of Care [facility]) CM/SW Contact:  Sherie Don, LCSW Phone Number: 10/25/2019, 6:35 PM  Clinical Narrative: CSW attempted to notify PA and EDP of need for [facility] orders being placed for PT/OT/SW/HHA. Corene Cornea with [facility] made aware.  Final next level of care: [place] Barriers to Discharge: Barriers Resolved  Patient Goals and CMS Choice Patient states their goals for this hospitalization and ongoing recovery are:: Discharge home  Discharge Plan and Services HH Arranged: PT, OT, Social Work, Nurse's Aide [place] Agency: [place] ([facility]) Date [place]: 10/25/19 Time [place]: 1705 Representative spoke with at [place]: Floydene Flock  Readmission Risk Interventions No flowsheet data found.

## 2019-10-25 NOTE — ED Notes (Signed)
Case manager in with pt to discuss home health

## 2019-10-25 NOTE — ED Notes (Signed)
Son in [place] in with pt  Family states there concerns about her living situation    Case worker in with family

## 2019-10-25 NOTE — TOC Initial Note (Addendum)
Transition of Care [facility]) - Initial/Assessment Note    Patient Details  Name: Mindy Garza MRN: [id number] Date of Birth: 1926/02/26  [facility] CM/SW Contact:    Ova Freshwater Phone Number: [phone] 10/25/2019, 2:20 PM  Clinical Narrative:                  Patient presents to the after a fall.  Patient lives with son who is her care giver, but there are family concerns with care giver dependability. Spoke with patient son-in-law for collateral information.  Son-in-law states the patient has declining memory and tends to for get what she did, or what occurred with growing frequency.  Son-in-law stated patient has a difficult time with ADLs and needs assistance, especially due to forgetfulness and ohysical limitations.  Patient does not have issues with continence but does have increasing difficulty ambulating quickly.   Son-in-law is concerned about the patient living on at home and wanted to speak with CSW about assisted living arrangements for the patient. This CSW  gave the patient's son-in-law information about requirements for assited living arrangement, home health arrangements, and different ways to identify cognitive decline with assessments.  PT consult has been placed forpossible home health needs.         Patient Goals and CMS Choice        Expected Discharge Plan and Services                                                Prior Living Arrangements/Services                       Activities of Daily Living      Permission Sought/Granted                  Emotional Assessment              Admission diagnosis:  fall Patient Active Problem List   Diagnosis Date Noted  . Chronic atrial fibrillation ([place]) 12/31/2013  . Tachycardia 12/31/2013  . Personal history of breast cancer 12/31/2013  . Recurrent breast cancer ([place]) 09/18/2012  . Personal history of colonic polyps    PCP:  Idelle Crouch,  MD Pharmacy:   [facility] [address] [place] [address] Phone: [phone] Fax: [place] [place] [address] Phone: [phone] Fax: [phone]     Social Determinants of Health (SDOH) Interventions    Readmission Risk Interventions No flowsheet data found.

## 2019-10-25 NOTE — ED Notes (Signed)
Leg sleeve applied to left leg.  Family with pt for discharge.

## 2019-10-25 NOTE — ED Triage Notes (Signed)
First Nurse Note:  Patient lost balance and fell. No loss of consciousness. On eliquis.

## 2019-10-25 NOTE — ED Notes (Signed)
See triage note  States she was leaning forward and her knees gave out  States she fell backwards  Denies any pain at present

## 2019-10-25 NOTE — ED Triage Notes (Signed)
Patient to ED via [facility]. Reports she was reaching for something in the dryer when her knees gave out and she fell. Patient denies hitting head or LOC. Complaining of pain in left knee. Patient does take Eliquis.

## 2019-10-25 NOTE — Discharge Instructions (Addendum)
Home health will call you for a follow-up.

## 2019-10-25 NOTE — Evaluation (Signed)
Physical Therapy Evaluation Patient Details Name: Mindy Garza MRN: [id number] DOB: [birth year] Today's Date: 10/25/2019   History of Present Illness  Patient is a 86 year old female admitted after being found down at home. Reports she did not fall she sat down. No PMH available  Clinical Impression  Patient is received on stretcher in ED. She is agreeable to PT assessment. She demonstrates the need for min assist with mobility on and off stretcher. Requires assistance for initial steadying when standing up to attain balance. Once balanced, she requires min guard for ambulation with RW of 100 feet. She has no LOB, demonstrates decreased step length, decreased foot clearance bilaterally, and decreased cadence. She denies pain at this time. Per her report her son lives with her and is able to provide her assistance. If she has reliable assistance she will be able to return home, if not she will benefit from 24 hour assist for safety. She will continue to benefit from skilled PT while here to improve functional independence and safety with mobility to prevent falls.       Follow Up Recommendations Home health PT;Supervision/Assistance - 24 hour;Supervision for mobility/OOB    Equipment Recommendations  None recommended by PT    Recommendations for Other Services       Precautions / Restrictions Precautions Precautions: Fall Restrictions Weight Bearing Restrictions: No      Mobility  Bed Mobility Overal bed mobility: Needs Assistance Bed Mobility: Supine to Sit;Sit to Supine     Supine to sit: Min assist Sit to supine: Min assist   General bed mobility comments: Requires assist for safety and to bring LEs back up onto stretcher  Transfers Overall transfer level: Needs assistance Equipment used: Rolling walker (2 wheeled) Transfers: Sit to/from Stand Sit to Stand: Min assist         General transfer comment: unsteady with initial standing balance. Requiring min assist to gain  balance.  Ambulation/Gait Ambulation/Gait assistance: Min guard Gait Distance (Feet): 100 Feet Assistive device: Rolling walker (2 wheeled) Gait Pattern/deviations: Step-to pattern;Trunk flexed;Decreased stride length;Decreased dorsiflexion - right;Decreased dorsiflexion - left Gait velocity: decr   General Gait Details: patient has very flexed posture which limits her ability to see objects in path. She is overall steady with use of RW once she has initial balance.  Stairs            Wheelchair Mobility    Modified Rankin (Stroke Patients Only)       Balance Overall balance assessment: Needs assistance Sitting-balance support: Feet supported;Bilateral upper extremity supported Sitting balance-Leahy Scale: Fair Sitting balance - Comments: when seated on high stretcher requires assistance   Standing balance support: Bilateral upper extremity supported;During functional activity Standing balance-Leahy Scale: Fair Standing balance comment: reliant on RW and supervison/cga for safety                             Pertinent Vitals/Pain Pain Assessment: No/denies pain    Home Living Family/patient expects to be discharged to:: Private residence Living Arrangements: Children Available Help at Discharge: Available PRN/intermittently Type of Home: House Home Access: Stairs to enter   [employer] of Steps: 1 step up Home Layout: One level Home Equipment: Walker - 4 wheels      Prior Function Level of Independence: Independent with assistive device(s)               Hand Dominance        Extremity/Trunk  Assessment   Upper Extremity Assessment Upper Extremity Assessment: Generalized weakness    Lower Extremity Assessment Lower Extremity Assessment: Generalized weakness    Cervical / Trunk Assessment Cervical / Trunk Assessment: Kyphotic  Communication   Communication: No difficulties  Cognition Arousal/Alertness:  Awake/alert Behavior During Therapy: WFL for tasks assessed/performed Overall Cognitive Status: Within Functional Limits for tasks assessed                                        General Comments      Exercises     Assessment/Plan    PT Assessment Patient needs continued PT services  PT Problem List Decreased strength;Decreased mobility;Decreased activity tolerance;Decreased balance;Decreased safety awareness       PT Treatment Interventions Therapeutic activities;Gait training;Therapeutic exercise;Functional mobility training;Balance training;Patient/family education    PT Goals (Current goals can be found in the Care Plan section)  Acute Rehab PT Goals Patient Stated Goal: to return home PT Goal Formulation: With patient Time For Goal Achievement: 11/01/19 Potential to Achieve Goals: Good    Frequency Min 2X/week   Barriers to discharge Decreased caregiver support patient lives with son, if he is able to provide assistance as needed, she would be able to return home. She requires 24 hour supervision    Co-evaluation               AM-PAC PT "6 Clicks" Mobility  Outcome Measure Help needed turning from your back to your side while in a flat bed without using bedrails?: A Little Help needed moving from lying on your back to sitting on the side of a flat bed without using bedrails?: A Little Help needed moving to and from a bed to a chair (including a wheelchair)?: A Little Help needed standing up from a chair using your arms (e.g., wheelchair or bedside chair)?: A Little Help needed to walk in hospital room?: A Little Help needed climbing 3-5 steps with a railing? : A Lot 6 Click Score: 17    End of Session Equipment Utilized During Treatment: Gait belt Activity Tolerance: Patient tolerated treatment well Patient left: in bed Nurse Communication: Mobility status PT Visit Diagnosis: Other abnormalities of gait and mobility (R26.89);History of  falling (Z91.81);Muscle weakness (generalized) (M62.81);Difficulty in walking, not elsewhere classified (R26.2)    Time: 1510-1535 PT Time Calculation (min) (ACUTE ONLY): 25 min   Charges:   PT Evaluation $PT Eval Moderate Complexity: 1 Mod PT Treatments $Gait Training: 8-22 mins        Bowen Kia, PT, GCS 10/25/19,3:51 PM

## 2019-10-25 NOTE — ED Provider Notes (Signed)
[facility] Emergency Department Provider Note ____________________________________________  Time seen: Approximately 3:57 PM  I have reviewed the triage vital signs and the nursing notes.   HISTORY  Chief Complaint Fall    HPI Mindy Garza is a 86 y.o. female who presents to the emergency department for evaluation and treatment of knee pain.  She was getting something out of the dryer when her knees "gave out" and she fell.  She landed directly on her knee.  She did not hit her head or except variance any loss of consciousness.  She does take Eliquis.   Past Medical History:  Diagnosis Date  . Atrial fibrillation [facility])    followed by Jordan Hawks, MD  . Cancer [facility]) 2001    right breast cancer diagnosed December 02, 2005 status post modified radical mastectomy. The patient had had an invasive carcinoma recurrence in five years after treatment for DCIS with wide excision and postoperative radiation therapy. Her tumor was estrogen positive. 0/15 nodes were negative. An axillary dissection was completed as a sentinel node could not be identified.   . Malignant neoplasm of upper-outer quadrant of female breast ([place])    Right, T1a, N0, M0 ER-positive, PR negative, HER-2/neu not overexpressing.    . Osteoporosis 2012  . Personal history of colonic polyps 2010   tubulovillous polyp of the appendiceal orifice    . Personal history of malignant neoplasm of breast 2001,2007    Patient Active Problem List   Diagnosis Date Noted  . Chronic atrial fibrillation ([place]) 12/31/2013  . Tachycardia 12/31/2013  . Personal history of breast cancer 12/31/2013  . Recurrent breast cancer ([place]) 09/18/2012  . Personal history of colonic polyps     Past Surgical History:  Procedure Laterality Date  . APPENDECTOMY    . BREAST LUMPECTOMY Right 2001  . COLON RESECTION  1994  . COLONOSCOPY  2010, 2014   Dr. Bary Castilla  . HAND SURGERY    . MASTECTOMY Right 2007  . SKIN CANCER EXCISION   2009  . TUBAL LIGATION      Prior to Admission medications   Medication Sig Start Date End Date Taking? Authorizing Provider  apixaban (ELIQUIS) 2.5 MG TABS tablet TAKE 1 TABLET BY MOUTH EVERY 12 HOURS 06/18/19  Yes [provider]  brimonidine-timolol (COMBIGAN) 0.2-0.5 % ophthalmic solution Place 1 drop into both eyes once.    [provider]  Calcium Carbonate-Vitamin D (CALCIUM 500/VITAMIN D PO) Take 1,500 mg by mouth daily.    [provider]  Cyanocobalamin (VITAMIN B-12 IJ) Inject 1 ampule as directed every 30 (thirty) days.    [provider]  diltiazem (DILACOR XR) 180 MG 24 hr capsule Take 180 mg by mouth daily.    [provider]  fish oil-omega-3 fatty acids 1000 MG capsule Take 2 g by mouth daily.    [provider]  fluticasone (FLONASE) 50 MCG/ACT nasal spray Place 2 sprays into the nose daily.    [provider]  LORazepam (ATIVAN) 1 MG tablet Take 1 mg by mouth every 8 (eight) hours.    [provider]  Multiple Vitamin (MULTIVITAMIN) tablet Take 1 tablet by mouth daily.    [provider]  vitamin E 1000 UNIT capsule Take 1,000 Units by mouth daily.    [provider]  warfarin (COUMADIN) 1 MG tablet Take 1 mg by mouth daily.    [provider]  zoledronic acid (RECLAST) 5 MG/100ML SOLN injection Inject 5 mg into  the vein once.    [provider]    Allergies Patient has no known allergies.  Family History  Problem Relation Age of Onset  . Breast cancer Other     Social History Social History   Tobacco Use  . Smoking status: Never Smoker  . Smokeless tobacco: Never Used  Substance Use Topics  . Alcohol use: No  . Drug use: No    Review of Systems Constitutional: Negative for fever. Cardiovascular: Negative for chest pain. Respiratory: Negative for shortness of breath. Musculoskeletal: Positive for left knee pain. Skin: Negative for open wound or  lesion Neurological: Negative for decrease in sensation  ____________________________________________   PHYSICAL EXAM:  VITAL SIGNS: ED Triage Vitals  Enc Vitals Group     BP 10/25/19 1141 110/62     Pulse Rate 10/25/19 1141 (!) 115     Resp 10/25/19 1141 16     Temp 10/25/19 1141 98.3 F (36.8 C)     Temp Source 10/25/19 1141 Oral     SpO2 10/25/19 1141 93 %     Weight 10/25/19 1142 134 lb (60.8 kg)     Height 10/25/19 1142 '5\' 4"'  (1.626 m)     Head Circumference --      Peak Flow --      Pain Score 10/25/19 1142 0     Pain Loc --      Pain Edu? --      Excl. in [place]? --     Constitutional: Alert and oriented. Well appearing and in no acute distress. Eyes: Conjunctivae are clear without discharge or drainage Head: Atraumatic Neck: Supple.  No focal midline tenderness. Respiratory: No cough. Respirations are even and unlabored. Musculoskeletal: Pain in the left knee with flexion.  No pain on full extension.  No varus or valgus pain.  Negative ballottement.  Lachman's is negative. Neurologic: Motor and sensory function is intact. Skin: No open wounds or lesions over the left knee. Psychiatric: Affect and behavior are appropriate.  ____________________________________________   LABS (all labs ordered are listed, but only abnormal results are displayed)  Labs Reviewed - No data to display ____________________________________________  RADIOLOGY  Fracture of the lateral intercondylar eminence without any other fracture, no dislocation, small joint effusion is noted.  Osteoarthritic changes are also noted most markedly medially.  I, Mindy Garza, personally viewed and evaluated these images (plain radiographs) as part of my medical decision making, as well as reviewing the written report by the radiologist.  DG Knee Complete 4 Views Left  Result Date: 10/25/2019 CLINICAL DATA:  Pain following fall EXAM: LEFT KNEE - COMPLETE 4+ VIEW COMPARISON:  None. FINDINGS: Frontal,  bilateral oblique, and lateral views were obtained. There is a fracture of the lateral intracondylar eminence. No other evident fracture. No dislocation. Small joint effusion present. There is severe joint space narrowing in the medial compartment. There is moderate narrowing of the patellofemoral joint. There is chondrocalcinosis. No erosive change. IMPRESSION: Fracture of the lateral intercondylar eminence. No other evident fracture. No dislocation. Small joint effusion. Osteoarthritic changes noted, most marked medially. There is chondrocalcinosis which may be seen with osteoarthritis or with calcium pyrophosphate deposition disease. Electronically Signed   By: Lowella Grip III M.D.   On: 10/25/2019 13:40   ____________________________________________   PROCEDURES  Procedures  ____________________________________________   INITIAL IMPRESSION / ASSESSMENT AND PLAN / ED COURSE  PACHIA STRUM is a 86 y.o. who presents to the emergency department for treatment and evaluation after mechanical, nonsyncopal  fall where she hit her knee.  Image does show a fracture of the lateral intercondylar eminence but no other fractures are noted.  Patient's son-in-law has arrived and explains that there are some living situation concerns shared by he and his wife.  He would like to have a social work consult as the patient is mainly responsible for all of her own ADLs.  She does have a son living there with her, but son-in-law reports that he is not a reliable resource any longer and he is concerned that something is going to happen to the patient while he is not there or otherwise occupied.  Consult has been requested.  Maurice Small Maxey with social work in with the son-in-law discussing conditions of the home.  Plan will be to also assess her PT capability.  PT has been able to walk with the patient with very little assistance after she is able to come to a standing position.  Patient is able to use her walker  to get to and from the bathroom while here in the ER. Maurice Small is to review the notes from PT and help decide on a plan. Patient is in bed resting without complaint at this time.   Ashok Cordia, PA-C will await final recommendations and proceed from there.   Medications  acetaminophen (TYLENOL) tablet 650 mg (650 mg Oral Given 10/25/19 1608)    Pertinent labs & imaging results that were available during my care of the patient were reviewed by me and considered in my medical decision making (see chart for details).   _________________________________________   FINAL CLINICAL IMPRESSION(S) / ED DIAGNOSES  Final diagnoses:  Injury of left knee, initial encounter    ED Discharge Orders    None       If controlled substance prescribed during this visit, 12 month history viewed on the [place] prior to issuing an initial prescription for Schedule II or III opiod.   Victorino Dike, FNP 10/25/19 1613    Nance Pear, MD 10/26/19 [phone]

## 2019-10-25 NOTE — ED Notes (Signed)
PT in with pt to evaluate

## 2019-10-25 NOTE — TOC Progression Note (Signed)
Transition of Care [facility]) - Progression Note    Patient Details  Name: Mindy Garza MRN: [id number] Date of Birth: 20-Aug-1925  Transition of [facility]) CM/SW [place], [place] Phone Number: [phone] 10/25/2019, 4:03 PM  Clinical Narrative:     PT consult recommended home health, this CSW contacted [facility] for home health needs. [facility] will contact the patient.       Expected Discharge Plan and Services                                                 Social Determinants of Health (SDOH) Interventions    Readmission Risk Interventions No flowsheet data found.

## 2020-05-12 ENCOUNTER — Encounter: Payer: Self-pay | Admitting: Physician Assistant

## 2020-05-12 ENCOUNTER — Emergency Department
Admission: EM | Admit: 2020-05-12 | Discharge: 2020-05-12 | Disposition: A | Discharge status: Home / Self Care | Payer: Medicare Other | Source: Home / Self Care | Attending: Emergency Medicine | Admitting: Emergency Medicine

## 2020-05-12 ENCOUNTER — Emergency Department: Payer: Medicare Other

## 2020-05-12 ENCOUNTER — Other Ambulatory Visit: Payer: Self-pay

## 2020-05-12 DIAGNOSIS — Z79899 Other long term (current) drug therapy: Secondary | ICD-10-CM | POA: Insufficient documentation

## 2020-05-12 DIAGNOSIS — Z853 Personal history of malignant neoplasm of breast: Secondary | ICD-10-CM | POA: Insufficient documentation

## 2020-05-12 DIAGNOSIS — M25561 Pain in right knee: Secondary | ICD-10-CM | POA: Insufficient documentation

## 2020-05-12 DIAGNOSIS — Z7901 Long term (current) use of anticoagulants: Secondary | ICD-10-CM | POA: Insufficient documentation

## 2020-05-12 DIAGNOSIS — M25461 Effusion, right knee: Secondary | ICD-10-CM | POA: Insufficient documentation

## 2020-05-12 DIAGNOSIS — R52 Pain, unspecified: Secondary | ICD-10-CM

## 2020-05-12 DIAGNOSIS — G8911 Acute pain due to trauma: Secondary | ICD-10-CM | POA: Diagnosis not present

## 2020-05-12 MED ORDER — TRAMADOL HCL 50 MG PO TABS: 50.0000 mg | ORAL_TABLET | Freq: Two times a day (BID) | ORAL | 0 refills | Status: AC

## 2020-05-12 MED ORDER — TRAMADOL HCL 50 MG PO TABS
50.0000 mg | ORAL_TABLET | Freq: Once | ORAL | Status: AC
  Administered 2020-05-12: 50 mg via ORAL
  Filled 2020-05-12: qty 1

## 2020-05-12 NOTE — ED Notes (Signed)
Patient verbalizes understanding of discharge instructions. Opportunity for questioning and answers were provided. Armband removed by staff, pt discharged from ED. Son in law called for ride home

## 2020-05-12 NOTE — ED Provider Notes (Signed)
[facility] Emergency Department Provider Note ____________________________________________  Time seen: 1147  I have reviewed the triage vital signs and the nursing notes.  HISTORY  Chief Complaint  Knee Pain  HPI Mindy Garza is a 86 y.o. female presents to the ED via EMS from home.   Patient presents for evaluation of right knee swelling and pain.  She describes injury occurred yesterday when she did not fall, but instead got weak in her knees, and knelt down to the ground.  Patient reports ongoing knee pain since the occurrence last night.  Her son with whom she lives, was present when the injury occurred.  She however had him call EMS to help get the patient off the floor and into a chair.  She uses a walker to ambulate regularly.  She gives a history of arthritis of the knees and hands, A. Fib, breast cancer, and osteoporosis.  She denies any other injury at this time.  Past Medical History:  Diagnosis Date  . Atrial fibrillation [facility])    followed by Jordan Hawks, MD  . Cancer [facility]) 2001    right breast cancer diagnosed December 02, 2005 status post modified radical mastectomy. The patient had had an invasive carcinoma recurrence in five years after treatment for DCIS with wide excision and postoperative radiation therapy. Her tumor was estrogen positive. 0/15 nodes were negative. An axillary dissection was completed as a sentinel node could not be identified.   . Malignant neoplasm of upper-outer quadrant of female breast ([place])    Right, T1a, N0, M0 ER-positive, PR negative, HER-2/neu not overexpressing.    . Osteoporosis 2012  . Personal history of colonic polyps 2010   tubulovillous polyp of the appendiceal orifice    . Personal history of malignant neoplasm of breast 2001,2007    Patient Active Problem List   Diagnosis Date Noted  . Chronic atrial fibrillation ([place]) 12/31/2013  . Tachycardia 12/31/2013  . Personal history of breast cancer 12/31/2013  .  Recurrent breast cancer ([place]) 09/18/2012  . Personal history of colonic polyps     Past Surgical History:  Procedure Laterality Date  . APPENDECTOMY    . BREAST LUMPECTOMY Right 2001  . COLON RESECTION  1994  . COLONOSCOPY  2010, 2014   Dr. Bary Castilla  . HAND SURGERY    . MASTECTOMY Right 2007  . SKIN CANCER EXCISION  2009  . TUBAL LIGATION      Prior to Admission medications   Medication Sig Start Date End Date Taking? Authorizing Provider  apixaban (ELIQUIS) 2.5 MG TABS tablet TAKE 1 TABLET BY MOUTH EVERY 12 HOURS 06/18/19   [provider]  brimonidine-timolol (COMBIGAN) 0.2-0.5 % ophthalmic solution Place 1 drop into both eyes once.    [provider]  Calcium Carbonate-Vitamin D (CALCIUM 500/VITAMIN D PO) Take 1,500 mg by mouth daily.    [provider]  Cyanocobalamin (VITAMIN B-12 IJ) Inject 1 ampule as directed every 30 (thirty) days.    [provider]  diltiazem (DILACOR XR) 180 MG 24 hr capsule Take 180 mg by mouth daily.    [provider]  fish oil-omega-3 fatty acids 1000 MG capsule Take 2 g by mouth daily.    [provider]  fluticasone (FLONASE) 50 MCG/ACT nasal spray Place 2 sprays into the nose daily.    [provider]  LORazepam (ATIVAN) 1 MG tablet Take 1 mg by mouth every 8 (eight) hours.    [provider]  Multiple Vitamin (  MULTIVITAMIN) tablet Take 1 tablet by mouth daily.    [provider]  traMADol (ULTRAM) 50 MG tablet Take 1 tablet (50 mg total) by mouth 2 (two) times daily for 5 days. 05/12/20 05/17/20  Wesleigh Markovic, Dannielle Karvonen, PA-C  vitamin E 1000 UNIT capsule Take 1,000 Units by mouth daily.    [provider]  zoledronic acid (RECLAST) 5 MG/100ML SOLN injection Inject 5 mg into the vein once.    [provider]  warfarin (COUMADIN) 1 MG tablet Take 1 mg by mouth daily.  05/12/20  [provider]    Allergies Patient has no known  allergies.  Family History  Problem Relation Age of Onset  . Breast cancer Other     Social History Social History   Tobacco Use  . Smoking status: Never Smoker  . Smokeless tobacco: Never Used  Substance Use Topics  . Alcohol use: No  . Drug use: No    Review of Systems  Constitutional: Negative for fever. Eyes: Negative for visual changes. ENT: Negative for sore throat. Cardiovascular: Negative for chest pain. Respiratory: Negative for shortness of breath. Gastrointestinal: Negative for abdominal pain, vomiting and diarrhea. Genitourinary: Negative for dysuria. Musculoskeletal: Negative for back pain. Right knee pain and swelling Skin: Negative for rash. Neurological: Negative for headaches, focal weakness or numbness. ____________________________________________  PHYSICAL EXAM:  VITAL SIGNS: ED Triage Vitals [05/12/20 1038]  Enc Vitals Group     BP 122/70     Pulse Rate 92     Resp 16     Temp 98.1 F (36.7 C)     Temp Source Oral     SpO2 98 %     Weight 134 lb 0.6 oz (60.8 kg)     Height '5\' 3"'  (1.6 m)     Head Circumference      Peak Flow      Pain Score 10     Pain Loc      Pain Edu?      Excl. in [place]?     Constitutional: Alert and oriented. Well appearing and in no distress. Head: Normocephalic and atraumatic. Eyes: Conjunctivae are normal. PERRL. Normal extraocular movements Ears: Canals clear. TMs intact bilaterally. Nose: No congestion/rhinorrhea/epistaxis. Mouth/Throat: Mucous membranes are moist. Neck: Supple. No thyromegaly. Hematological/Lymphatic/Immunological: No cervical lymphadenopathy. Cardiovascular: Normal rate, regular rhythm. Normal distal pulses. Respiratory: Normal respiratory effort. No wheezes/rales/rhonchi. Gastrointestinal: Soft and nontender. No distention. Musculoskeletal: Nontender with normal range of motion in all extremities.  Neurologic:  Normal gait without ataxia. Normal speech and language. No gross focal  neurologic deficits are appreciated. Skin:  Skin is warm, dry and intact. No rash noted. ___________________________________________   RADIOLOGY  DG Right Knee IMPRESSION: 1. No acute findings. 2. Mild-to-moderate osteoarthritic change and small joint effusion.  I, Melvenia Needles, personally viewed and evaluated these images (plain radiographs) as part of my medical decision making, as well as reviewing the written report by the radiologist. ____________________________________________  PROCEDURES  Ultram 50 mg PO  Procedures ____________________________________________  INITIAL IMPRESSION / ASSESSMENT AND PLAN / ED COURSE  Geriatric patient with ED evaluation of acute right knee pain after mechanical injury.  Patient describes knee giving out and went herself to the ground onto her knees last night.  She presents today with acute on chronic knee pain.  Exam is overall benign reassuring as it shows a mild effusion but no indication of internal derangement.  X-ray is also reassuring as it shows no acute intrathoracic arthropathy.  She does have chronic persistent arthritis.  Patient will be treated with tramadol for pain relief.  She is advised to take Tylenol at home for nondrowsy pain relief.  She is also advised to discontinue any anti-inflammatories given her anticoagulation therapy.  She will follow-up with her primary provider for ongoing symptoms.  CHAREESE SERGENT was evaluated in Emergency Department on 05/12/2020 for the symptoms described in the history of present illness. She was evaluated in the context of the global COVID-19 pandemic, which necessitated consideration that the patient might be at risk for infection with the SARS-CoV-2 virus that causes COVID-19. Institutional protocols and algorithms that pertain to the evaluation of patients at risk for COVID-19 are in a state of rapid change based on information released by regulatory bodies including the CDC and federal  and state organizations. These policies and algorithms were followed during the patient's care in the ED.  I reviewed the patient's prescription history over the last 12 months in the multi-state controlled substances database(s) that includes [place], [place], [place], [place], [place], [place], [place], [place], [place], [place], [place], [place], [place], and [place].  Results were notable for regular BZD prescription.  ____________________________________________  FINAL CLINICAL IMPRESSION(S) / ED DIAGNOSES  Final diagnoses:  Pain  Acute pain of right knee  Effusion of right knee      Melvenia Needles, PA-C 05/12/20 1340    Harvest Dark, MD 05/12/20 1516

## 2020-05-12 NOTE — ED Triage Notes (Signed)
Pt here with right knee swelling and pain. Pt states that she did not fall yesterday but that her knee got weak and she knelt down to the ground and has been having pain ever since. Pt NAD in triage.

## 2020-05-12 NOTE — Discharge Instructions (Addendum)
Your exam and x-ray shows some fluid to the knee following your injury.  You also have some underlying arthritis.  No fractures are noted.  Take the pain medicine as needed and take extra strength Tylenol for nondrowsy pain relief.  Follow-up with Dr. Doy Hutching for ongoing symptom management.

## 2020-05-13 ENCOUNTER — Inpatient Hospital Stay
Admission: EM | Admit: 2020-05-13 | Discharge: 2020-05-16 | DRG: 690 | Disposition: A | Discharge status: Skilled Nursing Facility | Payer: Medicare Other | Attending: Internal Medicine | Admitting: Internal Medicine

## 2020-05-13 ENCOUNTER — Emergency Department: Payer: Medicare Other

## 2020-05-13 ENCOUNTER — Encounter: Payer: Self-pay | Admitting: Emergency Medicine

## 2020-05-13 ENCOUNTER — Other Ambulatory Visit: Payer: Self-pay

## 2020-05-13 DIAGNOSIS — R296 Repeated falls: Secondary | ICD-10-CM | POA: Diagnosis present

## 2020-05-13 DIAGNOSIS — F419 Anxiety disorder, unspecified: Secondary | ICD-10-CM | POA: Diagnosis present

## 2020-05-13 DIAGNOSIS — I482 Chronic atrial fibrillation, unspecified: Secondary | ICD-10-CM | POA: Diagnosis present

## 2020-05-13 DIAGNOSIS — E876 Hypokalemia: Secondary | ICD-10-CM | POA: Diagnosis not present

## 2020-05-13 DIAGNOSIS — Z79899 Other long term (current) drug therapy: Secondary | ICD-10-CM | POA: Diagnosis not present

## 2020-05-13 DIAGNOSIS — Z20822 Contact with and (suspected) exposure to covid-19: Secondary | ICD-10-CM | POA: Diagnosis present

## 2020-05-13 DIAGNOSIS — Z23 Encounter for immunization: Secondary | ICD-10-CM

## 2020-05-13 DIAGNOSIS — B9620 Unspecified Escherichia coli [E. coli] as the cause of diseases classified elsewhere: Secondary | ICD-10-CM | POA: Diagnosis present

## 2020-05-13 DIAGNOSIS — Z9011 Acquired absence of right breast and nipple: Secondary | ICD-10-CM | POA: Diagnosis not present

## 2020-05-13 DIAGNOSIS — Z66 Do not resuscitate: Secondary | ICD-10-CM | POA: Diagnosis present

## 2020-05-13 DIAGNOSIS — Z7901 Long term (current) use of anticoagulants: Secondary | ICD-10-CM | POA: Diagnosis not present

## 2020-05-13 DIAGNOSIS — I4892 Unspecified atrial flutter: Secondary | ICD-10-CM | POA: Diagnosis present

## 2020-05-13 DIAGNOSIS — M170 Bilateral primary osteoarthritis of knee: Secondary | ICD-10-CM | POA: Diagnosis present

## 2020-05-13 DIAGNOSIS — M810 Age-related osteoporosis without current pathological fracture: Secondary | ICD-10-CM | POA: Diagnosis present

## 2020-05-13 DIAGNOSIS — N390 Urinary tract infection, site not specified: Principal | ICD-10-CM | POA: Diagnosis present

## 2020-05-13 DIAGNOSIS — N3000 Acute cystitis without hematuria: Secondary | ICD-10-CM

## 2020-05-13 DIAGNOSIS — Y92019 Unspecified place in single-family (private) house as the place of occurrence of the external cause: Secondary | ICD-10-CM | POA: Diagnosis not present

## 2020-05-13 DIAGNOSIS — G8911 Acute pain due to trauma: Secondary | ICD-10-CM | POA: Diagnosis present

## 2020-05-13 DIAGNOSIS — W1830XA Fall on same level, unspecified, initial encounter: Secondary | ICD-10-CM | POA: Diagnosis present

## 2020-05-13 DIAGNOSIS — M25561 Pain in right knee: Secondary | ICD-10-CM | POA: Diagnosis present

## 2020-05-13 DIAGNOSIS — Z853 Personal history of malignant neoplasm of breast: Secondary | ICD-10-CM

## 2020-05-13 DIAGNOSIS — Z85828 Personal history of other malignant neoplasm of skin: Secondary | ICD-10-CM | POA: Diagnosis not present

## 2020-05-13 DIAGNOSIS — R531 Weakness: Secondary | ICD-10-CM

## 2020-05-13 DIAGNOSIS — D72828 Other elevated white blood cell count: Secondary | ICD-10-CM | POA: Diagnosis not present

## 2020-05-13 LAB — CBC WITH DIFFERENTIAL/PLATELET
Abs Immature Granulocytes: 0.04 10*3/uL (ref 0.00–0.07)
Basophils Absolute: 0 10*3/uL (ref 0.0–0.1)
Basophils Relative: 0 %
Eosinophils Absolute: 0 10*3/uL (ref 0.0–0.5)
Eosinophils Relative: 0 %
HCT: 46.2 % — ABNORMAL HIGH (ref 36.0–46.0)
Hemoglobin: 15.1 g/dL — ABNORMAL HIGH (ref 12.0–15.0)
Immature Granulocytes: 0 %
Lymphocytes Relative: 8 %
Lymphs Abs: 1 10*3/uL (ref 0.7–4.0)
MCH: 30.4 pg (ref 26.0–34.0)
MCHC: 32.7 g/dL (ref 30.0–36.0)
MCV: 93 fL (ref 80.0–100.0)
Monocytes Absolute: 1 10*3/uL (ref 0.1–1.0)
Monocytes Relative: 8 %
Neutro Abs: 10.5 10*3/uL — ABNORMAL HIGH (ref 1.7–7.7)
Neutrophils Relative %: 84 %
Platelets: 218 10*3/uL (ref 150–400)
RBC: 4.97 MIL/uL (ref 3.87–5.11)
RDW: 14.3 % (ref 11.5–15.5)
WBC: 12.6 10*3/uL — ABNORMAL HIGH (ref 4.0–10.5)
nRBC: 0 % (ref 0.0–0.2)

## 2020-05-13 LAB — URINALYSIS, COMPLETE (UACMP) WITH MICROSCOPIC
Bilirubin Urine: NEGATIVE
Glucose, UA: NEGATIVE mg/dL
Ketones, ur: 20 mg/dL — AB
Nitrite: NEGATIVE
Protein, ur: 30 mg/dL — AB
Specific Gravity, Urine: 1.017 (ref 1.005–1.030)
WBC, UA: >50 WBC/hpf — ABNORMAL HIGH (ref 0–5)
pH: 5 (ref 5.0–8.0)

## 2020-05-13 LAB — RESPIRATORY PANEL BY RT PCR (FLU A&B, COVID)
Influenza A by PCR: NEGATIVE
Influenza B by PCR: NEGATIVE
SARS Coronavirus 2 by RT PCR: NEGATIVE

## 2020-05-13 LAB — COMPREHENSIVE METABOLIC PANEL
ALT: 21 U/L (ref 0–44)
AST: 29 U/L (ref 15–41)
Albumin: 3.8 g/dL (ref 3.5–5.0)
Alkaline Phosphatase: 87 U/L (ref 38–126)
Anion gap: 11 (ref 5–15)
BUN: 25 mg/dL — ABNORMAL HIGH (ref 8–23)
CO2: 25 mmol/L (ref 22–32)
Calcium: 8.9 mg/dL (ref 8.9–10.3)
Chloride: 103 mmol/L (ref 98–111)
Creatinine, Ser: 0.74 mg/dL (ref 0.44–1.00)
GFR, Estimated: >60 mL/min (ref >60)
Glucose, Bld: 149 mg/dL — ABNORMAL HIGH (ref 70–99)
Potassium: 3.9 mmol/L (ref 3.5–5.1)
Sodium: 139 mmol/L (ref 135–145)
Total Bilirubin: 1.4 mg/dL — ABNORMAL HIGH (ref 0.3–1.2)
Total Protein: 7.6 g/dL (ref 6.5–8.1)

## 2020-05-13 LAB — TROPONIN I (HIGH SENSITIVITY)
Troponin I (High Sensitivity): 20 ng/L — ABNORMAL HIGH (ref <18)
Troponin I (High Sensitivity): 35 ng/L — ABNORMAL HIGH (ref <18)

## 2020-05-13 MED ORDER — ONDANSETRON HCL 4 MG/2ML IJ SOLN: 4.0000 mg | Freq: Four times a day (QID) | INTRAMUSCULAR | Status: DC | PRN

## 2020-05-13 MED ORDER — ADULT MULTIVITAMIN W/MINERALS CH
1.0000 | ORAL_TABLET | Freq: Every day | ORAL | Status: DC
  Administered 2020-05-13 – 2020-05-16 (×4): 1 via ORAL
  Filled 2020-05-13 (×4): qty 1

## 2020-05-13 MED ORDER — BRIMONIDINE TARTRATE-TIMOLOL 0.2-0.5 % OP SOLN: 1.0000 [drp] | Freq: Once | OPHTHALMIC | Status: DC

## 2020-05-13 MED ORDER — TRAMADOL HCL 50 MG PO TABS
50.0000 mg | ORAL_TABLET | Freq: Two times a day (BID) | ORAL | Status: DC
  Administered 2020-05-13 – 2020-05-16 (×7): 50 mg via ORAL
  Filled 2020-05-13 (×7): qty 1

## 2020-05-13 MED ORDER — ONDANSETRON HCL 4 MG PO TABS: 4.0000 mg | ORAL_TABLET | Freq: Four times a day (QID) | ORAL | Status: DC | PRN

## 2020-05-13 MED ORDER — ACETAMINOPHEN 325 MG PO TABS
650.0000 mg | ORAL_TABLET | Freq: Four times a day (QID) | ORAL | Status: DC | PRN
  Administered 2020-05-14 – 2020-05-16 (×4): 650 mg via ORAL
  Filled 2020-05-13 (×4): qty 2

## 2020-05-13 MED ORDER — BRIMONIDINE TARTRATE 0.2 % OP SOLN: 1.0000 [drp] | Freq: Two times a day (BID) | OPHTHALMIC | Status: DC

## 2020-05-13 MED ORDER — BRIMONIDINE TARTRATE 0.2 % OP SOLN
1.0000 [drp] | Freq: Two times a day (BID) | OPHTHALMIC | Status: DC
  Administered 2020-05-13 – 2020-05-16 (×6): 1 [drp] via OPHTHALMIC
  Filled 2020-05-13: qty 5

## 2020-05-13 MED ORDER — APIXABAN 2.5 MG PO TABS
2.5000 mg | ORAL_TABLET | Freq: Two times a day (BID) | ORAL | Status: DC
  Administered 2020-05-13 – 2020-05-16 (×7): 2.5 mg via ORAL
  Filled 2020-05-13 (×7): qty 1

## 2020-05-13 MED ORDER — ACETAMINOPHEN 650 MG RE SUPP: 650.0000 mg | Freq: Four times a day (QID) | RECTAL | Status: DC | PRN

## 2020-05-13 MED ORDER — OXYCODONE-ACETAMINOPHEN 5-325 MG PO TABS
1.0000 | ORAL_TABLET | Freq: Once | ORAL | Status: AC
  Administered 2020-05-13: 1 via ORAL
  Filled 2020-05-13: qty 1

## 2020-05-13 MED ORDER — PIPERACILLIN-TAZOBACTAM 3.375 G IVPB
3.3750 g | Freq: Three times a day (TID) | INTRAVENOUS | Status: DC
  Administered 2020-05-13 – 2020-05-16 (×9): 3.375 g via INTRAVENOUS
  Filled 2020-05-13 (×9): qty 50

## 2020-05-13 MED ORDER — SODIUM CHLORIDE 0.9 % IV SOLN
1.0000 g | Freq: Once | INTRAVENOUS | Status: AC
  Administered 2020-05-13: 1 g via INTRAVENOUS
  Filled 2020-05-13: qty 10

## 2020-05-13 MED ORDER — TIMOLOL MALEATE 0.5 % OP SOLN
1.0000 [drp] | Freq: Two times a day (BID) | OPHTHALMIC | Status: DC
  Administered 2020-05-13 – 2020-05-16 (×7): 1 [drp] via OPHTHALMIC
  Filled 2020-05-13: qty 5

## 2020-05-13 MED ORDER — SODIUM CHLORIDE 0.9 % IV SOLN
1.0000 g | INTRAVENOUS | Status: DC
  Filled 2020-05-13: qty 10

## 2020-05-13 MED ORDER — LORAZEPAM 1 MG PO TABS: 1.0000 mg | ORAL_TABLET | Freq: Three times a day (TID) | ORAL | Status: DC | PRN

## 2020-05-13 NOTE — ED Provider Notes (Signed)
[facility] Emergency Department Provider Note   ____________________________________________   First MD Initiated Contact with Patient 05/13/20 [phone]     (approximate)  I have reviewed the triage vital signs and the nursing notes.   HISTORY  Chief Complaint Weakness    HPI Mindy Garza is a 86 y.o. female with possible history of chronic atrial fibrillation on Eliquis and breast cancer who presents to the ED complaining of generalized weakness.  4 of history is obtained from patient's daughter, who states that the patient has been in general decline since this summer, but this has rapidly increased over the past couple of days.  In the past, patient has been able to walk on her own without difficulty and she currently lives alone.  Over the past couple of days she has had multiple falls where her legs seem to give out on her.  Patient is unsure whether she hit her head or lost consciousness.  She does complain of pain to both legs, primarily at her left knee.  She has not had any fevers, cough, chest pain, shortness of breath, dysuria, hematuria, vomiting, or diarrhea.  Daughter is concerned about patient safety living at home by herself.        Past Medical History:  Diagnosis Date  . Atrial fibrillation [facility])    followed by Jordan Hawks, MD  . Cancer [facility]) 2001    right breast cancer diagnosed December 02, 2005 status post modified radical mastectomy. The patient had had an invasive carcinoma recurrence in five years after treatment for DCIS with wide excision and postoperative radiation therapy. Her tumor was estrogen positive. 0/15 nodes were negative. An axillary dissection was completed as a sentinel node could not be identified.   . Malignant neoplasm of upper-outer quadrant of female breast ([place])    Right, T1a, N0, M0 ER-positive, PR negative, HER-2/neu not overexpressing.    . Osteoporosis 2012  . Personal history of colonic polyps 2010    tubulovillous polyp of the appendiceal orifice    . Personal history of malignant neoplasm of breast 2001,2007    Patient Active Problem List   Diagnosis Date Noted  . Frequent falls 05/13/2020  . Anxiety 05/13/2020  . Acute lower UTI 05/13/2020  . Chronic atrial fibrillation ([place]) 12/31/2013  . Tachycardia 12/31/2013  . Personal history of breast cancer 12/31/2013  . Recurrent breast cancer ([place]) 09/18/2012  . Personal history of colonic polyps     Past Surgical History:  Procedure Laterality Date  . APPENDECTOMY    . BREAST LUMPECTOMY Right 2001  . COLON RESECTION  1994  . COLONOSCOPY  2010, 2014   Dr. Bary Castilla  . HAND SURGERY    . MASTECTOMY Right 2007  . SKIN CANCER EXCISION  2009  . TUBAL LIGATION      Prior to Admission medications   Medication Sig Start Date End Date Taking? Authorizing Provider  apixaban (ELIQUIS) 2.5 MG TABS tablet Take 2.5 mg by mouth 2 (two) times daily.  06/18/19  Yes [provider]  brimonidine (ALPHAGAN) 0.2 % ophthalmic solution Place 1 drop into both eyes 2 (two) times daily. 01/26/20  Yes [provider]  LORazepam (ATIVAN) 1 MG tablet Take 1 mg by mouth every 8 (eight) hours.   Yes [provider]  Multiple Vitamin (MULTIVITAMIN) tablet Take 1 tablet by mouth daily.   Yes [provider]  potassium chloride SA (KLOR-CON) 20 MEQ tablet Take 10 mEq by mouth daily. 1/2 tab once  daily 02/24/20  Yes [provider]  timolol (TIMOPTIC) 0.5 % ophthalmic solution Place 1 drop into both eyes 2 (two) times daily. 05/08/20  Yes [provider]  traMADol (ULTRAM) 50 MG tablet Take 1 tablet (50 mg total) by mouth 2 (two) times daily for 5 days. 05/12/20 05/17/20 Yes Menshew, Dannielle Karvonen, PA-C  vitamin E 1000 UNIT capsule Take 1,000 Units by mouth daily.   Yes [provider]  brimonidine-timolol (COMBIGAN) 0.2-0.5 % ophthalmic solution Place 1 drop into both eyes once.    [provider]  Calcium Carbonate-Vitamin D (CALCIUM 500/VITAMIN D PO) Take 1,500 mg by mouth daily. Patient not taking: Reported on 05/13/2020    [provider]  fish oil-omega-3 fatty acids 1000 MG capsule Take 2 g by mouth daily. Patient not taking: Reported on 05/13/2020    [provider]  warfarin (COUMADIN) 1 MG tablet Take 1 mg by mouth daily.  05/12/20  [provider]    Allergies Patient has no known allergies.  Family History  Problem Relation Age of Onset  . Breast cancer Other     Social History Social History   Tobacco Use  . Smoking status: Never Smoker  . Smokeless tobacco: Never Used  Substance Use Topics  . Alcohol use: No  . Drug use: No    Review of Systems  Constitutional: No fever/chills.  Positive for generalized weakness. Eyes: No visual changes. ENT: No sore throat. Cardiovascular: Denies chest pain. Respiratory: Denies shortness of breath. Gastrointestinal: No abdominal pain.  No nausea, no vomiting.  No diarrhea.  No constipation. Genitourinary: Negative for dysuria. Musculoskeletal: Negative for back pain.  Positive for left knee pain. Skin: Negative for rash. Neurological: Negative for headaches, focal weakness or numbness.  ____________________________________________   PHYSICAL EXAM:  VITAL SIGNS: ED Triage Vitals  Enc Vitals Group     BP 05/13/20 0936 132/79     Pulse Rate 05/13/20 0936 (!) 114     Resp 05/13/20 0936 17     Temp 05/13/20 0936 98.2 F (36.8 C)     Temp Source 05/13/20 0936 Oral     SpO2 --      Weight 05/13/20 0939 134 lb 0.6 oz (60.8 kg)     Height 05/13/20 0939 _0  (1.6 m)     Head Circumference --      Peak Flow --      Pain Score 05/13/20 0939 6     Pain Loc --      Pain Edu? --      Excl. in [place]? --     Constitutional: Alert and oriented. Eyes: Conjunctivae are normal. Head: Atraumatic. Nose: No congestion/rhinnorhea. Mouth/Throat: Mucous membranes are moist. Neck: Normal ROM,  no midline cervical spine tenderness. Cardiovascular: Tachycardic, irregularly irregular rhythm. Grossly normal heart sounds. Respiratory: Normal respiratory effort.  No retractions. Lungs CTAB. Gastrointestinal: Soft and nontender. No distention. Genitourinary: deferred Musculoskeletal: No lower extremity tenderness nor edema.  Diffuse tenderness to left knee with no obvious deformity. Neurologic:  Normal speech and language. No gross focal neurologic deficits are appreciated. Skin:  Skin is warm, dry and intact. No rash noted. Psychiatric: Mood and affect are normal. Speech and behavior are normal.  ____________________________________________   LABS (all labs ordered are listed, but only abnormal results are displayed)  Labs Reviewed  COMPREHENSIVE METABOLIC PANEL - Abnormal; Notable for the following components:      Result Value   Glucose, Bld 149 (*)  BUN 25 (*)    Total Bilirubin 1.4 (*)    All other components within normal limits  CBC WITH DIFFERENTIAL/PLATELET - Abnormal; Notable for the following components:   WBC 12.6 (*)    Hemoglobin 15.1 (*)    HCT 46.2 (*)    Neutro Abs 10.5 (*)    All other components within normal limits  URINALYSIS, COMPLETE (UACMP) WITH MICROSCOPIC - Abnormal; Notable for the following components:   Color, Urine YELLOW (*)    APPearance CLOUDY (*)    Hgb urine dipstick SMALL (*)    Ketones, ur 20 (*)    Protein, ur 30 (*)    Leukocytes,Ua LARGE (*)    WBC, UA >50 (*)    Bacteria, UA MANY (*)    All other components within normal limits  TROPONIN I (HIGH SENSITIVITY) - Abnormal; Notable for the following components:   Troponin I (High Sensitivity) 20 (*)    All other components within normal limits  RESPIRATORY PANEL BY RT PCR (FLU A&B, COVID)  URINE CULTURE  TROPONIN I (HIGH SENSITIVITY)   ____________________________________________  EKG  ED ECG REPORT I, Blake Divine, the attending physician, personally viewed and  interpreted this ECG.   Date: 05/13/2020  EKG Time: 9:45  Rate: 106  Rhythm: Atrial flutter  Axis: RAD  Intervals:nonspecific intraventricular conduction delay  ST&T Change: None   PROCEDURES  Procedure(s) performed (including Critical Care):  Procedures   ____________________________________________   INITIAL IMPRESSION / ASSESSMENT AND PLAN / ED COURSE       86 year old female with past medical history of breast cancer and atrial fibrillation on Eliquis who presents to the ED for generalized weakness and multiple falls over the past couple of days.  Given her repeated falls, we will check CT head but she has no cervical spine tenderness.  EKG shows atrial flutter with a variable rate, read by computer as concerning for acute MI but no evidence of STEMI by my read.  We will screen troponin but I have a low suspicion for ACS at this time.  We will check CBC, CMP, and UA for potential explanation for her generalized weakness.  Lab work is unremarkable, but UA looks consistent with UTI.  We will send urine for culture and treat with Rocephin.  Given her generalized weakness with multiple falls, case discussed with hospitalist for admission.      ____________________________________________   FINAL CLINICAL IMPRESSION(S) / ED DIAGNOSES  Final diagnoses:  Generalized weakness  Acute cystitis without hematuria     ED Discharge Orders    None       Note:  This document was prepared using Dragon voice recognition software and may include unintentional dictation errors.   Blake Divine, MD 05/13/20 [phone]

## 2020-05-13 NOTE — TOC Initial Note (Addendum)
Transition of Care [facility]) - Initial/Assessment Note    Patient Details  Name: Mindy Garza MRN: [id number] Date of Birth: [birth year]  [facility] of [facility]) CM/SW Contact:    Anselm Pancoast, RN Phone Number: 05/13/2020, 2:12 PM  Clinical Narrative:                 Received call from Daneen Schick, [place] @ MD office states patient is headed to ED for follow up and family will need assistance with placcement. Patient is walker at baseline and lives alone with disabled son who is unable to assist in her care. Daughter, [phone] is primary caregiver and hoping for placement at [facility] or [facility] as patient has been in the past.   Spoke to daughter, Butch Penny updating patient was being admitted and TOC would be working on placement at discharge. Family is requesting PT for potential SNF placement. Patient has been having increased falls related to knee pain. Patient has been requiring wheelchair transport due to increased knee pain.   Expected Discharge Plan: Skilled Nursing Facility Barriers to Discharge: Continued Medical Work up   Patient Goals and CMS Choice Patient states their goals for this hospitalization and ongoing recovery are:: Placement into SNF      Expected Discharge Plan and Services Expected Discharge Plan: [place]       Living arrangements for the past 2 months: Single Family Home                                      Prior Living Arrangements/Services Living arrangements for the past 2 months: Single Family Home Lives with:: Adult Children (Disabled son) Patient language and need for interpreter reviewed:: Yes Do you feel safe going back to the place where you live?: No   seeking rehab placement  Need for Family Participation in Patient Care: Yes (Comment) Care giver support system in place?: Yes (comment) Current home services: DME (wheelchair, walker) Criminal Activity/Legal Involvement Pertinent to Current Situation/Hospitalization:  No - Comment as needed  Activities of Daily Living Home Assistive Devices/Equipment: Walker (specify type) ADL Screening (condition at time of admission) Patient's cognitive ability adequate to safely complete daily activities?: Yes Is the patient deaf or have difficulty hearing?: No Does the patient have difficulty seeing, even when wearing glasses/contacts?: No Does the patient have difficulty concentrating, remembering, or making decisions?: No Patient able to express need for assistance with ADLs?: Yes Does the patient have difficulty dressing or bathing?: Yes Independently performs ADLs?: No Communication: Independent Dressing (OT): Needs assistance Is this a change from baseline?: Change from baseline, expected to last >3 days Grooming: Needs assistance Is this a change from baseline?: Change from baseline, expected to last >3 days Feeding: Independent Bathing: Needs assistance Is this a change from baseline?: Change from baseline, expected to last >3 days Toileting: Needs assistance Is this a change from baseline?: Change from baseline, expected to last >3days In/Out Bed: Needs assistance Is this a change from baseline?: Change from baseline, expected to last >3 days Walks in Home: Needs assistance Is this a change from baseline?: Change from baseline, expected to last >3 days Does the patient have difficulty walking or climbing stairs?: Yes Weakness of Legs: Both Weakness of Arms/Hands: None  Permission Sought/Granted Permission sought to share information with : Facility Art therapist granted to share information with : Yes, Verbal Permission Granted  Emotional Assessment Appearance:: Appears stated age Attitude/Demeanor/Rapport: Engaged Affect (typically observed): Accepting Orientation: : Oriented to Place, Oriented to  Time, Oriented to Situation Alcohol / Substance Use: Not Applicable Psych Involvement: No (comment)  Admission  diagnosis:  Frequent falls [R29.6] Patient Active Problem List   Diagnosis Date Noted  . Frequent falls 05/13/2020  . Chronic atrial fibrillation ([place]) 12/31/2013  . Tachycardia 12/31/2013  . Personal history of breast cancer 12/31/2013  . Recurrent breast cancer ([place]) 09/18/2012  . Personal history of colonic polyps    PCP:  Idelle Crouch, MD Pharmacy:   [facility] [address], [place] - [place] [address] Phone: [phone] Fax: [place], [place] [address] Phone: [phone] Fax: [phone]     Social Determinants of Health (SDOH) Interventions    Readmission Risk Interventions No flowsheet data found.

## 2020-05-13 NOTE — ED Triage Notes (Signed)
Pt presents via POV with c/o multiple falls over the weekend. According to daughter, pt normally lives by herself. Pt unable to ambulate now. Pt requiring extensive assistance to transfer to bed from RNs. Pt at baseline uses walker to get around house. Pt c/o "pain all over".

## 2020-05-13 NOTE — ED Notes (Signed)
This RN attempted to call report at this time.  

## 2020-05-13 NOTE — H&P (Signed)
History and Physical    [name] TDD:[id number] DOB: 1925-12-28 DOA: 05/13/2020  PCP: Idelle Crouch, MD   Patient coming from: Home  I have personally briefly reviewed patient's old medical records in [place]  Chief Complaint: Falls  HPI: Mindy Garza is a 86 y.o. female with medical history significant for A. fib on anticoagulation, history of osteoarthritis hypertension and osteoporosis who was brought into the ER for the second time in the last 24 hours for evaluation of fall.  Patient lives at home with her son who is unable to care for her.  Her daughter who is with her in the ER provides most of the history and states that she has had multiple falls over the last couple of days and was seen in the ER 24 hours prior to admission for evaluation of pain in her right knee following a fall.  Patient uses an assistive device and ambulates with a walker but despite this she continues to fall and her daughter states that her knees just give out on her.  She was discharged home with pain medication and her daughter states that she was unable to get out of the car when they got home and had a fall this morning prompting a repeat visit to the ER. She complains of pain in her left knee which she thinks is secondary to her known chronic arthritis.  She complains of urinary frequency and some dysuria as well as constipation and states that she had taken Imodium 2 days prior because of diarrhea. She denies having any chest pain, no shortness of breath, no dizziness, no lightheadedness, no nausea, no vomiting, no fever, no chills, no cough Labs show sodium 139, potassium 3.9, chloride 103, bicarb 25, glucose 149, BUN 25, creatinine 0.74, calcium 8.9, alkaline phosphatase 87, albumin 3.8, AST 29, ALT 21, total protein 7.6, troponin 20, white count 12.6, hemoglobin 15.1, hematocrit 46.2, MCV 93, RDW 14.3, platelet count 218 Left knee x-ray shows tricompartment degenerative change, most  prominent about the medial compartment.  Chondrocalcinosis.  No acute bony abnormality. Right knee x-ray shows mild to moderate osteoarthritic change and small joint effusion. CT scan of the head without contrast shows no evidence of intracranial injury.  Brain atrophy. Twelve-lead EKG shows atrial flutter   ED Course: Patient is a 86 year old Caucasian female who was brought into the ER for evaluation of frequent falls at home.  She is a high risk for developing fracture following this falls.  She is also noted to have a urinary tract infection and will be admitted to the hospital for further evaluation.  Review of Systems: As per HPI otherwise 10 point review of systems negative.  MAG3 scan   Past Medical History:  Diagnosis Date  . Atrial fibrillation [facility])    followed by Jordan Hawks, MD  . Cancer [facility]) 2001    right breast cancer diagnosed December 02, 2005 status post modified radical mastectomy. The patient had had an invasive carcinoma recurrence in five years after treatment for DCIS with wide excision and postoperative radiation therapy. Her tumor was estrogen positive. 0/15 nodes were negative. An axillary dissection was completed as a sentinel node could not be identified.   . Malignant neoplasm of upper-outer quadrant of female breast ([place])    Right, T1a, N0, M0 ER-positive, PR negative, HER-2/neu not overexpressing.    . Osteoporosis 2012  . Personal history of colonic polyps 2010   tubulovillous polyp of the appendiceal orifice    .  Personal history of malignant neoplasm of breast 2001,2007    Past Surgical History:  Procedure Laterality Date  . APPENDECTOMY    . BREAST LUMPECTOMY Right 2001  . COLON RESECTION  1994  . COLONOSCOPY  2010, 2014   Dr. Bary Castilla  . HAND SURGERY    . MASTECTOMY Right 2007  . SKIN CANCER EXCISION  2009  . TUBAL LIGATION       reports that she has never smoked. She has never used smokeless tobacco. She reports that she does not drink alcohol and  does not use drugs.  No Known Allergies  Family History  Problem Relation Age of Onset  . Breast cancer Other      Prior to Admission medications   Medication Sig Start Date End Date Taking? Authorizing Provider  apixaban (ELIQUIS) 2.5 MG TABS tablet Take 2.5 mg by mouth 2 (two) times daily.  06/18/19  Yes [provider]  brimonidine (ALPHAGAN) 0.2 % ophthalmic solution Place 1 drop into both eyes 2 (two) times daily. 01/26/20  Yes [provider]  LORazepam (ATIVAN) 1 MG tablet Take 1 mg by mouth every 8 (eight) hours.   Yes [provider]  Multiple Vitamin (MULTIVITAMIN) tablet Take 1 tablet by mouth daily.   Yes [provider]  potassium chloride SA (KLOR-CON) 20 MEQ tablet Take 10 mEq by mouth daily. 1/2 tab once daily 02/24/20  Yes [provider]  timolol (TIMOPTIC) 0.5 % ophthalmic solution Place 1 drop into both eyes 2 (two) times daily. 05/08/20  Yes [provider]  traMADol (ULTRAM) 50 MG tablet Take 1 tablet (50 mg total) by mouth 2 (two) times daily for 5 days. 05/12/20 05/17/20 Yes Menshew, Dannielle Karvonen, PA-C  vitamin E 1000 UNIT capsule Take 1,000 Units by mouth daily.   Yes [provider]  brimonidine-timolol (COMBIGAN) 0.2-0.5 % ophthalmic solution Place 1 drop into both eyes once.    [provider]  Calcium Carbonate-Vitamin D (CALCIUM 500/VITAMIN D PO) Take 1,500 mg by mouth daily. Patient not taking: Reported on 05/13/2020    [provider]  fish oil-omega-3 fatty acids 1000 MG capsule Take 2 g by mouth daily. Patient not taking: Reported on 05/13/2020    [provider]  warfarin (COUMADIN) 1 MG tablet Take 1 mg by mouth daily.  05/12/20  [provider]    Physical Exam: Vitals:   05/13/20 1230 05/13/20 1300 05/13/20 1330 05/13/20 1400  BP: (!) 152/69 (!) 173/87 (!) 161/88 132/67  Pulse: (!) 110 (!) 57 85 77  Resp: '18 17 19 19  ' Temp:      TempSrc:        SpO2: 94% 94% 97% 94%  Weight:      Height:         Vitals:   05/13/20 1230 05/13/20 1300 05/13/20 1330 05/13/20 1400  BP: (!) 152/69 (!) 173/87 (!) 161/88 132/67  Pulse: (!) 110 (!) 57 85 77  Resp: '18 17 19 19  ' Temp:      TempSrc:      SpO2: 94% 94% 97% 94%  Weight:      Height:        Constitutional: NAD, alert and oriented x 2.  Person and place.  Anxious Eyes: PERRL, lids and conjunctivae normal ENMT: Mucous membranes are moist.  Neck: normal, supple, no masses, no thyromegaly Respiratory: clear to auscultation bilaterally, no wheezing, no crackles. Normal respiratory effort. No accessory muscle use.  Cardiovascular: Irregularly irregular, no  murmurs / rubs / gallops. No extremity edema. 2+ pedal pulses. No carotid bruits.  Abdomen: Suprapubic tenderness, no masses palpated. No hepatosplenomegaly. Bowel sounds positive.  Musculoskeletal: no clubbing / cyanosis. No joint deformity upper and lower extremities.  Right knee swelling Skin: no rashes, lesions, ulcers.  Neurologic: No gross focal neurologic deficit.  Generalized weakness Psychiatric: Normal mood and affect.   Labs on Admission: I have personally reviewed following labs and imaging studies  CBC: Recent Labs  Lab 05/13/20 0941  WBC 12.6*  NEUTROABS 10.5*  HGB 15.1*  HCT 46.2*  MCV 93.0  PLT 360   Basic Metabolic Panel: Recent Labs  Lab 05/13/20 0941  NA 139  K 3.9  CL 103  CO2 25  GLUCOSE 149*  BUN 25*  CREATININE 0.74  CALCIUM 8.9   GFR: Estimated Creatinine Clearance: 37.1 mL/min (by C-G formula based on SCr of 0.74 mg/dL). Liver Function Tests: Recent Labs  Lab 05/13/20 0941  AST 29  ALT 21  ALKPHOS 87  BILITOT 1.4*  PROT 7.6  ALBUMIN 3.8   No results for input(s): LIPASE, AMYLASE in the last 168 hours. No results for input(s): AMMONIA in the last 168 hours. Coagulation Profile: No results for input(s): INR, PROTIME in the last 168 hours. Cardiac Enzymes: No results for  input(s): CKTOTAL, CKMB, CKMBINDEX, TROPONINI in the last 168 hours. BNP (last 3 results) No results for input(s): PROBNP in the last 8760 hours. HbA1C: No results for input(s): HGBA1C in the last 72 hours. CBG: No results for input(s): GLUCAP in the last 168 hours. Lipid Profile: No results for input(s): CHOL, HDL, LDLCALC, TRIG, CHOLHDL, LDLDIRECT in the last 72 hours. Thyroid Function Tests: No results for input(s): TSH, T4TOTAL, FREET4, T3FREE, THYROIDAB in the last 72 hours. Anemia Panel: No results for input(s): VITAMINB12, FOLATE, FERRITIN, TIBC, IRON, RETICCTPCT in the last 72 hours. Urine analysis:    Component Value Date/Time   COLORURINE YELLOW (A) 05/13/2020 1222   APPEARANCEUR CLOUDY (A) 05/13/2020 1222   APPEARANCEUR Hazy 06/25/2014 1944   LABSPEC 1.017 05/13/2020 1222   LABSPEC 1.008 06/25/2014 1944   PHURINE 5.0 05/13/2020 1222   GLUCOSEU NEGATIVE 05/13/2020 1222   GLUCOSEU Negative 06/25/2014 1944   HGBUR SMALL (A) 05/13/2020 1222   BILIRUBINUR NEGATIVE 05/13/2020 1222   BILIRUBINUR Negative 06/25/2014 1944   KETONESUR 20 (A) 05/13/2020 1222   PROTEINUR 30 (A) 05/13/2020 1222   NITRITE NEGATIVE 05/13/2020 1222   LEUKOCYTESUR LARGE (A) 05/13/2020 1222   LEUKOCYTESUR 1+ 06/25/2014 1944    Radiological Exams on Admission: DG Knee 2 Views Left  Result Date: 05/13/2020 CLINICAL DATA:  Fall. EXAM: LEFT KNEE - 1-2 VIEW COMPARISON:  No prior. FINDINGS: No evidence of effusion. Tricompartment degenerative change, most prominent about the medial compartment noted. Chondrocalcinosis. No acute bony abnormality. No evidence of fracture dislocation. Peripheral vascular calcification. IMPRESSION: 1. Tricompartment degenerative change, most prominent about the medial compartment. Chondrocalcinosis. No acute bony abnormality. 2. Peripheral vascular disease. Electronically Signed   By: Marcello Moores  Register   On: 05/13/2020 10:41   DG Knee 1-2 Views Right  Result Date:  05/12/2020 CLINICAL DATA:  Right knee pain and swelling. EXAM: RIGHT KNEE - 1-2 VIEW COMPARISON:  None. FINDINGS: Mild-to-moderate osteoarthritic change most prominent over the patellofemoral joint and medial compartments. Suggestion of a small joint effusion. No evidence of fracture or dislocation. IMPRESSION: 1. No acute findings. 2. Mild-to-moderate osteoarthritic change and small joint effusion. Electronically Signed   By: Marin Olp M.D.  On: 05/12/2020 11:13   CT Head Wo Contrast  Result Date: 05/13/2020 CLINICAL DATA:  Minor head trauma EXAM: CT HEAD WITHOUT CONTRAST TECHNIQUE: Contiguous axial images were obtained from the base of the skull through the vertex without intravenous contrast. COMPARISON:  05/01/2014 FINDINGS: Brain: No evidence of acute infarction, hemorrhage, hydrocephalus, extra-axial collection or mass lesion/mass effect. Brain atrophy most notable at the anterior temporal lobes and at the occipital parietal sulci. Mild for age chronic white matter disease. Vascular: No hyperdense vessel or unexpected calcification. Skull: No acute fracture Sinuses/Orbits: No evidence of injury. Bilateral cataract resection. IMPRESSION: No evidence of intracranial injury. Brain atrophy. Electronically Signed   By: Monte Fantasia M.D.   On: 05/13/2020 10:28    EKG: Independently reviewed.  Atrial flutter  Assessment/Plan Principal Problem:   Frequent falls Active Problems:   Chronic atrial fibrillation (HCC)   Anxiety   Acute lower UTI    Frequent falls Patient was sent to the ER for evaluation of frequent falls and is a high risk for fracture She lives at home and has a son who is unable to provide care for her at home She will need PT evaluation and most likely subacute rehab upon discharge   Urinary tract infection Patient noted to have pyuria We will place patient empirically on Rocephin until urine culture results become available   Anxiety Continue  Lorazepam   Chronic A. Fib Rate controlled Continue Apixaban    Bilateral knee pain Secondary to osteoarthritis Continue tramadol as needed  DVT prophylaxis: Apixaban Code Status: DNR Family Communication: Greater than 50% of time was spent discussing plan of care with patient's daughter at the bedside. She verbalizes understanding and agrees with the plan. Code status was discussed and patient is a DNR Disposition Plan: Subacute rehab Consults called: PT    Corona Popovich MD [facility]     05/13/2020, 2:14 PM

## 2020-05-14 DIAGNOSIS — R296 Repeated falls: Secondary | ICD-10-CM

## 2020-05-14 DIAGNOSIS — D72828 Other elevated white blood cell count: Secondary | ICD-10-CM

## 2020-05-14 DIAGNOSIS — N390 Urinary tract infection, site not specified: Principal | ICD-10-CM

## 2020-05-14 LAB — CBC
HCT: 40 % (ref 36.0–46.0)
Hemoglobin: 13.1 g/dL (ref 12.0–15.0)
MCH: 30.4 pg (ref 26.0–34.0)
MCHC: 32.8 g/dL (ref 30.0–36.0)
MCV: 92.8 fL (ref 80.0–100.0)
Platelets: 244 10*3/uL (ref 150–400)
RBC: 4.31 MIL/uL (ref 3.87–5.11)
RDW: 13.9 % (ref 11.5–15.5)
WBC: 15.8 10*3/uL — ABNORMAL HIGH (ref 4.0–10.5)
nRBC: 0 % (ref 0.0–0.2)

## 2020-05-14 LAB — BASIC METABOLIC PANEL
Anion gap: 10 (ref 5–15)
BUN: 28 mg/dL — ABNORMAL HIGH (ref 8–23)
CO2: 27 mmol/L (ref 22–32)
Calcium: 8.4 mg/dL — ABNORMAL LOW (ref 8.9–10.3)
Chloride: 103 mmol/L (ref 98–111)
Creatinine, Ser: 0.72 mg/dL (ref 0.44–1.00)
GFR, Estimated: >60 mL/min (ref >60)
Glucose, Bld: 121 mg/dL — ABNORMAL HIGH (ref 70–99)
Potassium: 3.3 mmol/L — ABNORMAL LOW (ref 3.5–5.1)
Sodium: 140 mmol/L (ref 135–145)

## 2020-05-14 MED ORDER — POTASSIUM CHLORIDE CRYS ER 20 MEQ PO TBCR
40.0000 meq | EXTENDED_RELEASE_TABLET | Freq: Once | ORAL | Status: AC
  Administered 2020-05-14: 40 meq via ORAL
  Filled 2020-05-14: qty 2

## 2020-05-14 MED ORDER — POTASSIUM CHLORIDE 20 MEQ/15ML (10%) PO SOLN
10.0000 meq | Freq: Every day | ORAL | Status: DC
  Administered 2020-05-15 – 2020-05-16 (×2): 10 meq via ORAL
  Filled 2020-05-14 (×2): qty 15

## 2020-05-14 MED ORDER — DICLOFENAC SODIUM 1 % EX GEL
2.0000 g | Freq: Four times a day (QID) | CUTANEOUS | Status: DC
  Administered 2020-05-14 – 2020-05-16 (×8): 2 g via TOPICAL
  Filled 2020-05-14: qty 100

## 2020-05-14 NOTE — Evaluation (Signed)
Occupational Therapy Evaluation Patient Details Name: Mindy Garza MRN: [id number] DOB: Nov 09, 1925 Today's Date: 05/14/2020    History of Present Illness Pt is a 86 y/o F with PMH: Afib on ACs, h/o OA, HTN, and osteoporosis who was brought to the ED for the second time in last 24 hours d/t falls at home. Of note: was admitted d/t fall 6MA as well.   Clinical Impression   Pt was seen for OT evaluation this date. Prior to hospital admission, pt was MOD I for ADL transfers and mobility with 4WW and was MOD I for BADLs except had some assist for bathing from [facility] aides. Pt lives with her son who she reports is disabled in an apartment with level entry (small threshold). Currently pt demonstrates impairments as described below (See OT problem list) which functionally limit her ability to perform ADL/self-care tasks. Pt currently requires MIN A for bed mobility, MAX A for ADL transfers, and MOD A For LB ADLs.  Pt would benefit from skilled OT services to address noted impairments and functional limitations (see below for any additional details) in order to maximize safety and independence while minimizing falls risk and caregiver burden. Upon hospital discharge, recommend STR to maximize pt safety and return to PLOF.     Follow Up Recommendations  SNF    Equipment Recommendations  3 in 1 bedside commode;Tub/shower seat    Recommendations for Other Services       Precautions / Restrictions Precautions Precautions: Fall Restrictions Weight Bearing Restrictions: No      Mobility Bed Mobility Overal bed mobility: Needs Assistance Bed Mobility: Supine to Sit     Supine to sit: Min assist          Transfers Overall transfer level: Needs assistance Equipment used: Rolling walker (2 wheeled) Transfers: Sit to/from [facility] Sit to Stand: Max assist Stand pivot transfers: Max assist       General transfer comment: cues for hand placement, foot placement-bend knees,  cues to lean forward. Pt leans back and is fearful with standing    Balance Overall balance assessment: Needs assistance   Sitting balance-Leahy Scale: Fair Sitting balance - Comments: requires UE support     Standing balance-Leahy Scale: Zero Standing balance comment: requires UE support and MAX A                           ADL either performed or assessed with clinical judgement   ADL                                         General ADL Comments: MIN A for seated UB ADLs including washing/drying hair, MOD A for LB dressing including donning socks bed level. Requires MAX A for standing LB ADLs such as peri care.     Vision Baseline Vision/History: Wears glasses Patient Visual Report: No change from baseline       Perception     Praxis      Pertinent Vitals/Pain Pain Assessment: 0-10 Pain Score: 4  Pain Location: general back/bottom and knees when weightbearing Pain Descriptors / Indicators: Sore Pain Intervention(s): Limited activity within patient's tolerance;Monitored during session     Hand Dominance     Extremity/Trunk Assessment Upper Extremity Assessment Upper Extremity Assessment: Generalized weakness   Lower Extremity Assessment Lower Extremity Assessment: Generalized weakness  Communication Communication Communication: No difficulties   Cognition Arousal/Alertness: Awake/alert Behavior During Therapy: WFL for tasks assessed/performed Overall Cognitive Status: Within Functional Limits for tasks assessed                                 General Comments: pt does have some delayed responses, but is oriented and appropriate with commands. Demos some fearfulness r/t mobilizing   General Comments       Exercises Other Exercises Other Exercises: OT facilitates ed re: role of OT in acute setting, importance of OOB activity, safe use of RW including hand/foot placement, safe transfer technique. Pt with  moderate reception, but wil require f/u.   Shoulder Instructions      Home Living Family/patient expects to be discharged to:: Private residence Living Arrangements: Children (lives with her disabled son,  Dtr-Donna lives close and is involved in her care.) Available Help at Discharge: Available PRN/intermittently Type of Home: Apartment Home Access: Other (comment) Entrance Stairs-Number of Steps: threshold   Home Layout: One level               Home Equipment: Walker - 4 wheels;Walker - 2 wheels;Wheelchair - manual          Prior Functioning/Environment Level of Independence: Needs assistance  Gait / Transfers Assistance Needed: Pt reports she was able to mobilize around the home with 4WW, but has recently been requiring w/c more often d/t falls. ADL's / Homemaking Assistance Needed: Reports she is able to perform BADLs mostly herself, but endorses having aides that help bathe 3 days a week and her daughter, Butch Penny, helps other days.            OT Problem List: Decreased strength;Decreased activity tolerance;Impaired balance (sitting and/or standing);Decreased safety awareness;Decreased knowledge of use of DME or AE;Pain      OT Treatment/Interventions: Self-care/ADL training;DME and/or AE instruction;Therapeutic activities;Balance training;Therapeutic exercise;Energy conservation;Patient/family education    OT Goals(Current goals can be found in the care plan section) Acute Rehab OT Goals Patient Stated Goal: to get stronger and stop falling so much OT Goal Formulation: With patient/family Time For Goal Achievement: 05/28/20 Potential to Achieve Goals: Good ADL Goals Pt Will Perform Lower Body Bathing: with min assist;sit to/from stand Pt Will Perform Upper Body Dressing: with set-up;sitting Pt Will Perform Lower Body Dressing: with min assist;sitting/lateral leans (with AE PRN) Pt Will Transfer to Toilet: with min assist;stand pivot transfer;bedside commode Pt  Will Perform Toileting - Clothing Manipulation and hygiene: with min assist;sit to/from stand Pt/caregiver will Perform Home Exercise Program: Increased strength;Both right and left upper extremity;With minimal assist  OT Frequency: Min 1X/week   Barriers to D/C: Decreased caregiver support          Co-evaluation              AM-PAC OT "6 Clicks" Daily Activity     Outcome Measure Help from another person eating meals?: None Help from another person taking care of personal grooming?: A Little Help from another person toileting, which includes using toliet, bedpan, or urinal?: A Lot Help from another person bathing (including washing, rinsing, drying)?: A Lot Help from another person to put on and taking off regular upper body clothing?: A Little Help from another person to put on and taking off regular lower body clothing?: A Lot 6 Click Score: 16   End of Session Equipment Utilized During Treatment: Gait belt;Rolling walker Nurse Communication: Mobility status  Activity Tolerance: Patient tolerated treatment well Patient left: in chair;with call bell/phone within reach;with chair alarm set;with family/visitor present  OT Visit Diagnosis: Unsteadiness on feet (R26.81);Muscle weakness (generalized) (M62.81)                Time: 2903-7955 OT Time Calculation (min): 68 min Charges:  OT General Charges $OT Visit: 1 Visit OT Evaluation $OT Eval Moderate Complexity: 1 Mod OT Treatments $Self Care/Home Management : 23-37 mins $Therapeutic Activity: 23-37 mins  Gerrianne Scale, MS, OTR/L ascom [phone] 05/14/20, 1:30 PM

## 2020-05-14 NOTE — TOC Progression Note (Signed)
Transition of Care [facility]) - Progression Note    Patient Details  Name: JULL HARRAL MRN: [id number] Date of Birth: 13-Aug-1925  Transition of Care [facility]) CM/SW [place], LCSW Phone Number: 05/14/2020, 10:33 AM  Clinical Narrative: CSW met with patient. Daughter at bedside. CSW introduced role and explained that discharge planning would be discussed. They confirmed plan to go to [facility] at discharge. Daughter has already been in contact with admissions coordinator. Patient will start with rehab and then transition to long-term care afterwards. Patient has had friends to go to [facility] and her pastor goes there weekly. Plan to private pay until she qualifies for Medicaid. Daughter stated her brother lives with patient, has a history of substance abuse. She has already notified staff that he is very upset about patient going to a facility and plans to come to the hospital tomorrow. Daughter told staff that if he starts to upset patient, to call security.     Expected Discharge Plan: [place] Barriers to Discharge: Continued Medical Work up  Expected Discharge Plan and Services Expected Discharge Plan: [place] arrangements for the past 2 months: Single Family Home                                       Social Determinants of Health (SDOH) Interventions    Readmission Risk Interventions No flowsheet data found.

## 2020-05-14 NOTE — Evaluation (Signed)
Physical Therapy Evaluation Patient Details Name: Mindy Garza MRN: [id number] DOB: [birth year] Today's Date: 05/14/2020   History of Present Illness  Pt is a 86 y/o F with PMH: Afib on ACs, h/o OA, HTN, and osteoporosis who was brought to the ED for the second time in last 24 hours d/t falls at home. Of note: was admitted d/t fall 6MA as well.  Clinical Impression  Pt received in recliner chair upon arrival to room and reluctant for PT evaluation, initially stating, "please don't make me stand up". Pt strength assessed while sitting in recliner chair and although strength was sufficient for assessed activities, she presents with generalized weakness and pain in bilateral knees. Pt performed anterior leaning activities to promote bringing COG within BOS due to posterior lean and resistance to upright posture with functional mobility. Pt then requested to return to bed. Pt required max A to stand and remain upright with stand pivot transfer attempt and became very anxious and more resistive to balance assistance. Pt began to flail her arms despite verbal cues to grasp RW along with strong posterior lean making transfer unsafe and required manual placement of hips onto bed. Pt presents with deficits in strength, balance, functional mobility, and safety awareness. Pt would benefit from skilled PT to address current deficits and STR to maximize functional mobility, safety, and independence and decrease falls risk.    Follow Up Recommendations SNF;Supervision for mobility/OOB    Equipment Recommendations  None recommended by PT (pt has equipment)    Recommendations for Other Services       Precautions / Restrictions Precautions Precautions: Fall Precaution Comments: increased fear of falling Restrictions Weight Bearing Restrictions: No      Mobility  Bed Mobility Overal bed mobility: Needs Assistance Bed Mobility: Sit to Supine     Supine to sit: Min assist Sit to supine: Min assist    General bed mobility comments: for trunk control on descent; increased verbal cues for sequencing of task to scoot herself to the [facility]    Transfers Overall transfer level: Needs assistance Equipment used: Rolling walker (2 wheeled) Transfers: Stand Pivot Transfers Sit to Stand: Max assist Stand pivot transfers: Max assist       General transfer comment: pt very resistive to bring COG within BOS pushing/leaning posteriorly; once pt fearful, she has a difficulty time following instructions to improve set up/technique  Ambulation/Gait             General Gait Details: not safe to attempt  Stairs            Wheelchair Mobility    Modified Rankin (Stroke Patients Only)       Balance Overall balance assessment: Needs assistance Sitting-balance support: Feet supported Sitting balance-Leahy Scale: Fair Sitting balance - Comments: with and without UE support Postural control: Posterior lean Standing balance support: Bilateral upper extremity supported;Single extremity supported;During functional activity Standing balance-Leahy Scale: Zero Standing balance comment: requires UE support and MAX A                             Pertinent Vitals/Pain Pain Assessment: Faces Pain Score: 7  Pain Location: bilateral knees Pain Descriptors / Indicators: Discomfort;Grimacing;Moaning;Sore Pain Intervention(s): Limited activity within patient's tolerance;Repositioned    Home Living Family/patient expects to be discharged to:: Private residence Living Arrangements: Children (lives with disabled son) Available Help at Discharge: Available PRN/intermittently Type of Home: Apartment Home Access: Other (comment) (threshold)   Entrance Stairs-Number  of Steps: threshold Home Layout: One level Home Equipment: [place] - 4 wheels;Walker - 2 wheels;Wheelchair - manual      Prior Function Level of Independence: Needs assistance   Gait / Transfers Assistance Needed: Pt  reports she was able to mobilize around the home with 4WW, but has recently been requiring w/c more often d/t falls.           Hand Dominance        Extremity/Trunk Assessment   Upper Extremity Assessment Upper Extremity Assessment: Generalized weakness (grossly 3+ to 4-/5 bilaterally)    Lower Extremity Assessment Lower Extremity Assessment: Generalized weakness (grossly 3+/5 bilaterally)       Communication   Communication: No difficulties  Cognition Arousal/Alertness: Awake/alert Behavior During Therapy: WFL for tasks assessed/performed Overall Cognitive Status: Within Functional Limits for tasks assessed                                 General Comments: pt alert and oriented x 4      General Comments      Exercises Other Exercises Other Exercises: OT facilitates ed re: role of OT in acute setting, importance of OOB activity, safe use of RW including hand/foot placement, safe transfer technique. Pt with moderate reception, but wil require f/u. Other Exercises: in sitting: anterior weight shifts x 10, 2 reps; anterior reaching for core activation x 10 Other Exercises: in sitting: hip ab/add and SLR x 10 bilat.   Assessment/Plan    PT Assessment Patient needs continued PT services  PT Problem List Decreased strength;Decreased activity tolerance;Decreased balance;Decreased mobility;Decreased knowledge of use of DME;Decreased safety awareness;Pain       PT Treatment Interventions DME instruction;Gait training;Stair training;Functional mobility training;Therapeutic activities;Therapeutic exercise;Balance training;Patient/family education    PT Goals (Current goals can be found in the Care Plan section)  Acute Rehab PT Goals Patient Stated Goal: to get stronger and stop falling so much PT Goal Formulation: With patient Time For Goal Achievement: 05/28/20 Potential to Achieve Goals: Good    Frequency Min 2X/week   Barriers to discharge         Co-evaluation               AM-PAC PT "6 Clicks" Mobility  Outcome Measure Help needed turning from your back to your side while in a flat bed without using bedrails?: A Little Help needed moving from lying on your back to sitting on the side of a flat bed without using bedrails?: A Lot Help needed moving to and from a bed to a chair (including a wheelchair)?: A Lot Help needed standing up from a chair using your arms (e.g., wheelchair or bedside chair)?: A Lot Help needed to walk in hospital room?: Total Help needed climbing 3-5 steps with a railing? : Total 6 Click Score: 11    End of Session Equipment Utilized During Treatment: Gait belt Activity Tolerance: Other (comment) (pt limited by fear and anxiety) Patient left: in bed;with call bell/phone within reach;with bed alarm set Nurse Communication: Mobility status PT Visit Diagnosis: Unsteadiness on feet (R26.81);Other abnormalities of gait and mobility (R26.89);Repeated falls (R29.6);Muscle weakness (generalized) (M62.81);History of falling (Z91.81);Difficulty in walking, not elsewhere classified (R26.2);Pain Pain - Right/Left:  (bilateral) Pain - part of body: Knee    Time: 1520-1550 PT Time Calculation (min) (ACUTE ONLY): 30 min   Charges:             Vale Haven, SPT  Vale Haven 05/14/2020, 5:05 PM

## 2020-05-14 NOTE — Progress Notes (Signed)
PROGRESS NOTE    Mindy Garza  OZH:[id number] DOB: 1925/12/12 DOA: 05/13/2020 PCP: Idelle Crouch, MD    Assessment & Plan:   Principal Problem:   Frequent falls Active Problems:   Chronic atrial fibrillation ([place])   Anxiety   Acute lower UTI   Frequent falls: PT/OT consulted. High risk for fracture   UTI: UA is positive, urine cx is pending. Continue on IV zosyn   Leukocytosis: likely secondary to infection. Continue on IV abxs   Hypokalemia: KCl repleted. Will continue to monitor  Anxiety: severity unknown. Continue on lorazepam prn   Chronic a. fib: not on any rate controlling meds as per med rec. Continue on eliquis   B/l knee pain: secondary to OA. Continue on tramadol prn     DVT prophylaxis: eliquis  Code Status: DNR Family Communication: Disposition Plan: likely d/c to SNF    Status is: Inpatient  Remains inpatient appropriate because:Ongoing diagnostic testing needed not appropriate for outpatient work up and IV treatments appropriate due to intensity of illness or inability to take PO   Dispo: The patient is from: Home              Anticipated d/c is to: SNF              Anticipated d/c date is: 3 days              Patient currently is not medically stable to d/c.     Consultants:      Procedures:    Antimicrobials: zosyn    Subjective: Pt c/o malaise   Objective: Vitals:   05/13/20 1956 05/13/20 2353 05/14/20 0427 05/14/20 0731  BP: 130/64 135/70 127/71 (!) 107/51  Pulse: 74 78 84 77  Resp: 18 20 18 20   Temp: 99.1 F (37.3 C) 98.5 F (36.9 C) 98.7 F (37.1 C) 98.5 F (36.9 C)  TempSrc: Oral  Oral Oral  SpO2: 93% 93% 93% 96%  Weight:      Height:        Intake/Output Summary (Last 24 hours) at 05/14/2020 0745 Last data filed at 05/14/2020 0731 Gross per 24 hour  Intake 100 ml  Output 850 ml  Net -750 ml   Filed Weights   05/13/20 0939  Weight: 60.8 kg    Examination:  General exam: Appears calm and  comfortable  Respiratory system: Clear to auscultation. Respiratory effort normal. Cardiovascular system: irregularly irregular. No rubs, gallops or clicks.  Gastrointestinal system: Abdomen is nondistended, soft and nontender.  Normal bowel sounds heard. Central nervous system: Alert and oriented. Moves all 4 extremities  Psychiatry: Judgement and insight appear normal. Mood & affect appropriate.     Data Reviewed: I have personally reviewed following labs and imaging studies  CBC: Recent Labs  Lab 05/13/20 0941 05/14/20 0507  WBC 12.6* 15.8*  NEUTROABS 10.5*  --   HGB 15.1* 13.1  HCT 46.2* 40.0  MCV 93.0 92.8  PLT 218 629   Basic Metabolic Panel: Recent Labs  Lab 05/13/20 0941 05/14/20 0507  NA 139 140  K 3.9 3.3*  CL 103 103  CO2 25 27  GLUCOSE 149* 121*  BUN 25* 28*  CREATININE 0.74 0.72  CALCIUM 8.9 8.4*   GFR: Estimated Creatinine Clearance: 37.1 mL/min (by C-G formula based on SCr of 0.72 mg/dL). Liver Function Tests: Recent Labs  Lab 05/13/20 0941  AST 29  ALT 21  ALKPHOS 87  BILITOT 1.4*  PROT 7.6  ALBUMIN 3.8  No results for input(s): LIPASE, AMYLASE in the last 168 hours. No results for input(s): AMMONIA in the last 168 hours. Coagulation Profile: No results for input(s): INR, PROTIME in the last 168 hours. Cardiac Enzymes: No results for input(s): CKTOTAL, CKMB, CKMBINDEX, TROPONINI in the last 168 hours. BNP (last 3 results) No results for input(s): PROBNP in the last 8760 hours. HbA1C: No results for input(s): HGBA1C in the last 72 hours. CBG: No results for input(s): GLUCAP in the last 168 hours. Lipid Profile: No results for input(s): CHOL, HDL, LDLCALC, TRIG, CHOLHDL, LDLDIRECT in the last 72 hours. Thyroid Function Tests: No results for input(s): TSH, T4TOTAL, FREET4, T3FREE, THYROIDAB in the last 72 hours. Anemia Panel: No results for input(s): VITAMINB12, FOLATE, FERRITIN, TIBC, IRON, RETICCTPCT in the last 72 hours. Sepsis  Labs: No results for input(s): PROCALCITON, LATICACIDVEN in the last 168 hours.  Recent Results (from the past 240 hour(s))  Respiratory Panel by RT PCR (Flu A&B, Covid) - Nasopharyngeal Swab     Status: None   Collection Time: 05/13/20 12:58 PM   Specimen: Nasopharyngeal Swab  Result Value Ref Range Status   SARS Coronavirus 2 by RT PCR NEGATIVE NEGATIVE Final    Comment: (NOTE) SARS-CoV-2 target nucleic acids are NOT DETECTED.  The SARS-CoV-2 RNA is generally detectable in upper respiratoy specimens during the acute phase of infection. The lowest concentration of SARS-CoV-2 viral copies this assay can detect is 131 copies/mL. A negative result does not preclude SARS-Cov-2 infection and should not be used as the sole basis for treatment or other patient management decisions. A negative result may occur with  improper specimen collection/handling, submission of specimen other than nasopharyngeal swab, presence of viral mutation(s) within the areas targeted by this assay, and inadequate number of viral copies (<131 copies/mL). A negative result must be combined with clinical observations, patient history, and epidemiological information. The expected result is Negative.  Fact Sheet for Patients:  PinkCheek.be  Fact Sheet for Healthcare Providers:  GravelBags.it  This test is no t yet approved or cleared by the [place] FDA and  has been authorized for detection and/or diagnosis of SARS-CoV-2 by FDA under an Emergency Use Authorization (EUA). This EUA will remain  in effect (meaning this test can be used) for the duration of the COVID-19 declaration under Section 564(b)(1) of the Act, 21 U.S.C. section 360bbb-3(b)(1), unless the authorization is terminated or revoked sooner.     Influenza A by PCR NEGATIVE NEGATIVE Final   Influenza B by PCR NEGATIVE NEGATIVE Final    Comment: (NOTE) The Xpert Xpress  SARS-CoV-2/FLU/RSV assay is intended as an aid in  the diagnosis of influenza from Nasopharyngeal swab specimens and  should not be used as a sole basis for treatment. Nasal washings and  aspirates are unacceptable for Xpert Xpress SARS-CoV-2/FLU/RSV  testing.  Fact Sheet for Patients: PinkCheek.be  Fact Sheet for Healthcare Providers: GravelBags.it  This test is not yet approved or cleared by the [place] FDA and  has been authorized for detection and/or diagnosis of SARS-CoV-2 by  FDA under an Emergency Use Authorization (EUA). This EUA will remain  in effect (meaning this test can be used) for the duration of the  Covid-19 declaration under Section 564(b)(1) of the Act, 21  U.S.C. section 360bbb-3(b)(1), unless the authorization is  terminated or revoked. Performed at [facility], [address], [place], [place] [address]          Radiology Studies: DG Knee 2 Views Left  Result Date: 05/13/2020 CLINICAL DATA:  Fall. EXAM: LEFT KNEE - 1-2 VIEW COMPARISON:  No prior. FINDINGS: No evidence of effusion. Tricompartment degenerative change, most prominent about the medial compartment noted. Chondrocalcinosis. No acute bony abnormality. No evidence of fracture dislocation. Peripheral vascular calcification. IMPRESSION: 1. Tricompartment degenerative change, most prominent about the medial compartment. Chondrocalcinosis. No acute bony abnormality. 2. Peripheral vascular disease. Electronically Signed   By: Marcello Moores  Register   On: 05/13/2020 10:41   DG Knee 1-2 Views Right  Result Date: 05/12/2020 CLINICAL DATA:  Right knee pain and swelling. EXAM: RIGHT KNEE - 1-2 VIEW COMPARISON:  None. FINDINGS: Mild-to-moderate osteoarthritic change most prominent over the patellofemoral joint and medial compartments. Suggestion of a small joint effusion. No evidence of fracture or dislocation. IMPRESSION: 1. No acute  findings. 2. Mild-to-moderate osteoarthritic change and small joint effusion. Electronically Signed   By: Marin Olp M.D.   On: 05/12/2020 11:13   CT Head Wo Contrast  Result Date: 05/13/2020 CLINICAL DATA:  Minor head trauma EXAM: CT HEAD WITHOUT CONTRAST TECHNIQUE: Contiguous axial images were obtained from the base of the skull through the vertex without intravenous contrast. COMPARISON:  05/01/2014 FINDINGS: Brain: No evidence of acute infarction, hemorrhage, hydrocephalus, extra-axial collection or mass lesion/mass effect. Brain atrophy most notable at the anterior temporal lobes and at the occipital parietal sulci. Mild for age chronic white matter disease. Vascular: No hyperdense vessel or unexpected calcification. Skull: No acute fracture Sinuses/Orbits: No evidence of injury. Bilateral cataract resection. IMPRESSION: No evidence of intracranial injury. Brain atrophy. Electronically Signed   By: Monte Fantasia M.D.   On: 05/13/2020 10:28        Scheduled Meds: . apixaban  2.5 mg Oral BID  . brimonidine  1 drop Both Eyes BID  . diclofenac Sodium  2 g Topical QID  . multivitamin with minerals  1 tablet Oral Daily  . timolol  1 drop Both Eyes BID  . traMADol  50 mg Oral BID   Continuous Infusions: . piperacillin-tazobactam (ZOSYN)  IV 3.375 g (05/14/20 0612)     LOS: 1 day    Time spent: 30 mins    Wyvonnia Dusky, MD [facility] Pager [phone]  If 7PM-7AM, please contact night-coverage 05/14/2020, 7:45 AM

## 2020-05-15 DIAGNOSIS — R296 Repeated falls: Secondary | ICD-10-CM | POA: Diagnosis not present

## 2020-05-15 DIAGNOSIS — N390 Urinary tract infection, site not specified: Secondary | ICD-10-CM | POA: Diagnosis not present

## 2020-05-15 DIAGNOSIS — D72828 Other elevated white blood cell count: Secondary | ICD-10-CM | POA: Diagnosis not present

## 2020-05-15 LAB — CBC
HCT: 39.1 % (ref 36.0–46.0)
Hemoglobin: 13.1 g/dL (ref 12.0–15.0)
MCH: 30.6 pg (ref 26.0–34.0)
MCHC: 33.5 g/dL (ref 30.0–36.0)
MCV: 91.4 fL (ref 80.0–100.0)
Platelets: 249 10*3/uL (ref 150–400)
RBC: 4.28 MIL/uL (ref 3.87–5.11)
RDW: 14.1 % (ref 11.5–15.5)
WBC: 12.4 10*3/uL — ABNORMAL HIGH (ref 4.0–10.5)
nRBC: 0 % (ref 0.0–0.2)

## 2020-05-15 LAB — BASIC METABOLIC PANEL
Anion gap: 13 (ref 5–15)
BUN: 33 mg/dL — ABNORMAL HIGH (ref 8–23)
CO2: 25 mmol/L (ref 22–32)
Calcium: 8.5 mg/dL — ABNORMAL LOW (ref 8.9–10.3)
Chloride: 103 mmol/L (ref 98–111)
Creatinine, Ser: 0.85 mg/dL (ref 0.44–1.00)
GFR, Estimated: >60 mL/min (ref >60)
Glucose, Bld: 99 mg/dL (ref 70–99)
Potassium: 3.7 mmol/L (ref 3.5–5.1)
Sodium: 141 mmol/L (ref 135–145)

## 2020-05-15 LAB — RESP PANEL BY RT-PCR (FLU A&B, COVID) ARPGX2
Influenza A by PCR: NEGATIVE
Influenza B by PCR: NEGATIVE
SARS Coronavirus 2 by RT PCR: NEGATIVE

## 2020-05-15 LAB — URINE CULTURE: Culture: >=100000 — AB

## 2020-05-15 MED ORDER — POLYVINYL ALCOHOL 1.4 % OP SOLN
1.0000 [drp] | OPHTHALMIC | Status: DC | PRN
  Administered 2020-05-15 – 2020-05-16 (×3): 1 [drp] via OPHTHALMIC
  Filled 2020-05-15 (×3): qty 15

## 2020-05-15 NOTE — TOC Progression Note (Addendum)
Transition of Care [facility]) - Progression Note    Patient Details  Name: Mindy Garza MRN: [id number] Date of Birth: Feb 03, 1926  Transition of [facility]) CM/SW [place], LCSW Phone Number: 05/15/2020, 11:27 AM  Clinical Narrative:  [facility] has reviewed and accepted referral on the hub. Per MD, likely discharge tomorrow. Left voicemail for admissions coordinator to notify. Asked him to call back to confirm they will have a bed.   11:46 am: [facility] will have a bed tomorrow. Left daughter a Advertising account executive. Will update her when she calls back. Patient is aware. COVID test has been ordered. DNR on chart to be signed.  2:25 pm: CSW called and updated daughter. She has an appointment at SNF this afternoon to complete admissions paperwork.  Expected Discharge Plan: [place] Barriers to Discharge: Continued Medical Work up  Expected Discharge Plan and Services Expected Discharge Plan: [place] arrangements for the past 2 months: Single Family Home                                       Social Determinants of Health (SDOH) Interventions    Readmission Risk Interventions No flowsheet data found.

## 2020-05-15 NOTE — Progress Notes (Signed)
PROGRESS NOTE    Mindy Garza  XKG:[id number] DOB: 21-Jun-1926 DOA: 05/13/2020 PCP: Idelle Crouch, MD    Assessment & Plan:   Principal Problem:   Frequent falls Active Problems:   Chronic atrial fibrillation ([place])   Anxiety   Acute lower UTI   Frequent falls: PT/OT recs SNF. Pt is agreeable   UTI: UA is positive, urine cx is growing e.coli and will switch to po cefdinir at d/c. Continue on IV zosyn while inpatient    Leukocytosis: likely secondary to infection. Continue on IV abxs   Hypokalemia: WNL today.   Anxiety: severity unknown. Lorazepam prn   Chronic a. fib: not on any rate controlling meds. Continue on eliquis   B/l knee pain: secondary to OA. Continue on tramadol prn     DVT prophylaxis: eliquis  Code Status: DNR Family Communication: Disposition Plan: likely d/c to SNF    Status is: Inpatient  Remains inpatient appropriate because:Ongoing diagnostic testing needed not appropriate for outpatient work up and IV treatments appropriate due to intensity of illness or inability to take PO   Dispo: The patient is from: Home              Anticipated d/c is to: SNF              Anticipated d/c date is: 1 day               Patient currently is not medically stable to d/c.     Consultants:      Procedures:    Antimicrobials: zosyn    Subjective: Pt c/o fatigue   Objective: Vitals:   05/14/20 1421 05/14/20 1952 05/14/20 2323 05/15/20 0444  BP: 116/65 (!) 143/75 128/74 (!) 146/63  Pulse: 76 78 77 82  Resp: 20 18 16 20   Temp: 98 F (36.7 C) 98.4 F (36.9 C) 98 F (36.7 C) (!) 97.5 F (36.4 C)  TempSrc: Oral Oral Axillary Oral  SpO2: 96% 94% 95% 97%  Weight:      Height:        Intake/Output Summary (Last 24 hours) at 05/15/2020 0746 Last data filed at 05/15/2020 0258 Gross per 24 hour  Intake 120 ml  Output --  Net 120 ml   Filed Weights   05/13/20 0939  Weight: 60.8 kg    Examination:  General exam: Appears calm but  uncomfortable  Respiratory system: clear breath sounds b/l. No rales.  Cardiovascular system: irregularly irregular. No rubs or gallops Gastrointestinal system: Abdomen is nondistended, soft and nontender. Hypoactive bowel sounds  Central nervous system: Alert and oriented.  Moves all 4 extremities   Psychiatry: Judgement and insight appear normal. Mood & affect appropriate.     Data Reviewed: I have personally reviewed following labs and imaging studies  CBC: Recent Labs  Lab 05/13/20 0941 05/14/20 0507 05/15/20 0530  WBC 12.6* 15.8* 12.4*  NEUTROABS 10.5*  --   --   HGB 15.1* 13.1 13.1  HCT 46.2* 40.0 39.1  MCV 93.0 92.8 91.4  PLT 218 244 702   Basic Metabolic Panel: Recent Labs  Lab 05/13/20 0941 05/14/20 0507 05/15/20 0530  NA 139 140 141  K 3.9 3.3* 3.7  CL 103 103 103  CO2 25 27 25   GLUCOSE 149* 121* 99  BUN 25* 28* 33*  CREATININE 0.74 0.72 0.85  CALCIUM 8.9 8.4* 8.5*   GFR: Estimated Creatinine Clearance: 34.9 mL/min (by C-G formula based on SCr of 0.85 mg/dL). Liver Function Tests: Recent  Labs  Lab 05/13/20 0941  AST 29  ALT 21  ALKPHOS 87  BILITOT 1.4*  PROT 7.6  ALBUMIN 3.8   No results for input(s): LIPASE, AMYLASE in the last 168 hours. No results for input(s): AMMONIA in the last 168 hours. Coagulation Profile: No results for input(s): INR, PROTIME in the last 168 hours. Cardiac Enzymes: No results for input(s): CKTOTAL, CKMB, CKMBINDEX, TROPONINI in the last 168 hours. BNP (last 3 results) No results for input(s): PROBNP in the last 8760 hours. HbA1C: No results for input(s): HGBA1C in the last 72 hours. CBG: No results for input(s): GLUCAP in the last 168 hours. Lipid Profile: No results for input(s): CHOL, HDL, LDLCALC, TRIG, CHOLHDL, LDLDIRECT in the last 72 hours. Thyroid Function Tests: No results for input(s): TSH, T4TOTAL, FREET4, T3FREE, THYROIDAB in the last 72 hours. Anemia Panel: No results for input(s): VITAMINB12,  FOLATE, FERRITIN, TIBC, IRON, RETICCTPCT in the last 72 hours. Sepsis Labs: No results for input(s): PROCALCITON, LATICACIDVEN in the last 168 hours.  Recent Results (from the past 240 hour(s))  Urine culture     Status: Abnormal (Preliminary result)   Collection Time: 05/13/20 12:22 PM   Specimen: Urine, Clean Catch  Result Value Ref Range Status   Specimen Description   Final    URINE, CLEAN CATCH Performed at [facility], [address] [place], [place] [address]    Special Requests   Final    NONE Performed at [facility], [address], [place], [place] [address]    Culture (A)  Final    >=100,000 COLONIES/mL ESCHERICHIA COLI SUSCEPTIBILITIES TO FOLLOW Performed at [facility], [employer] [place] [address], [place], [place] [address]    Report Status PENDING  Incomplete  Respiratory Panel by RT PCR (Flu A&B, Covid) - Nasopharyngeal Swab     Status: None   Collection Time: 05/13/20 12:58 PM   Specimen: Nasopharyngeal Swab  Result Value Ref Range Status   SARS Coronavirus 2 by RT PCR NEGATIVE NEGATIVE Final    Comment: (NOTE) SARS-CoV-2 target nucleic acids are NOT DETECTED.  The SARS-CoV-2 RNA is generally detectable in upper respiratoy specimens during the acute phase of infection. The lowest concentration of SARS-CoV-2 viral copies this assay can detect is 131 copies/mL. A negative result does not preclude SARS-Cov-2 infection and should not be used as the sole basis for treatment or other patient management decisions. A negative result may occur with  improper specimen collection/handling, submission of specimen other than nasopharyngeal swab, presence of viral mutation(s) within the areas targeted by this assay, and inadequate number of viral copies (<131 copies/mL). A negative result must be combined with clinical observations, patient history, and epidemiological information. The expected result is Negative.  Fact Sheet for Patients:    PinkCheek.be  Fact Sheet for Healthcare Providers:  GravelBags.it  This test is no t yet approved or cleared by the [place] FDA and  has been authorized for detection and/or diagnosis of SARS-CoV-2 by FDA under an Emergency Use Authorization (EUA). This EUA will remain  in effect (meaning this test can be used) for the duration of the COVID-19 declaration under Section 564(b)(1) of the Act, 21 U.S.C. section 360bbb-3(b)(1), unless the authorization is terminated or revoked sooner.     Influenza A by PCR NEGATIVE NEGATIVE Final   Influenza B by PCR NEGATIVE NEGATIVE Final    Comment: (NOTE) The Xpert Xpress SARS-CoV-2/FLU/RSV assay is intended as an aid in  the diagnosis of influenza from  Nasopharyngeal swab specimens and  should not be used as a sole basis for treatment. Nasal washings and  aspirates are unacceptable for Xpert Xpress SARS-CoV-2/FLU/RSV  testing.  Fact Sheet for Patients: PinkCheek.be  Fact Sheet for Healthcare Providers: GravelBags.it  This test is not yet approved or cleared by the [place] FDA and  has been authorized for detection and/or diagnosis of SARS-CoV-2 by  FDA under an Emergency Use Authorization (EUA). This EUA will remain  in effect (meaning this test can be used) for the duration of the  Covid-19 declaration under Section 564(b)(1) of the Act, 21  U.S.C. section 360bbb-3(b)(1), unless the authorization is  terminated or revoked. Performed at [facility], [address] [place], [place] [address]          Radiology Studies: DG Knee 2 Views Left  Result Date: 05/13/2020 CLINICAL DATA:  Fall. EXAM: LEFT KNEE - 1-2 VIEW COMPARISON:  No prior. FINDINGS: No evidence of effusion. Tricompartment degenerative change, most prominent about the medial compartment noted. Chondrocalcinosis. No acute bony  abnormality. No evidence of fracture dislocation. Peripheral vascular calcification. IMPRESSION: 1. Tricompartment degenerative change, most prominent about the medial compartment. Chondrocalcinosis. No acute bony abnormality. 2. Peripheral vascular disease. Electronically Signed   By: Marcello Moores  Register   On: 05/13/2020 10:41   CT Head Wo Contrast  Result Date: 05/13/2020 CLINICAL DATA:  Minor head trauma EXAM: CT HEAD WITHOUT CONTRAST TECHNIQUE: Contiguous axial images were obtained from the base of the skull through the vertex without intravenous contrast. COMPARISON:  05/01/2014 FINDINGS: Brain: No evidence of acute infarction, hemorrhage, hydrocephalus, extra-axial collection or mass lesion/mass effect. Brain atrophy most notable at the anterior temporal lobes and at the occipital parietal sulci. Mild for age chronic white matter disease. Vascular: No hyperdense vessel or unexpected calcification. Skull: No acute fracture Sinuses/Orbits: No evidence of injury. Bilateral cataract resection. IMPRESSION: No evidence of intracranial injury. Brain atrophy. Electronically Signed   By: Monte Fantasia M.D.   On: 05/13/2020 10:28        Scheduled Meds: . apixaban  2.5 mg Oral BID  . brimonidine  1 drop Both Eyes BID  . diclofenac Sodium  2 g Topical QID  . multivitamin with minerals  1 tablet Oral Daily  . potassium chloride  10 mEq Oral Daily  . timolol  1 drop Both Eyes BID  . traMADol  50 mg Oral BID   Continuous Infusions: . piperacillin-tazobactam (ZOSYN)  IV 3.375 g (05/15/20 0533)     LOS: 2 days    Time spent: 31 mins    Wyvonnia Dusky, MD [facility] Pager [phone]  If 7PM-7AM, please contact night-coverage 05/15/2020, 7:46 AM

## 2020-05-15 NOTE — NC FL2 (Signed)
[place] LEVEL OF CARE SCREENING TOOL     IDENTIFICATION  Patient Name: Mindy Garza Birthdate: [birth year] Sex: female Admission Date (Current Location): 05/13/2020  [place] and [place] Number:  Engineering geologist and Address:  [facility], [address], [place], [place] [address]      Provider Number: [id number]  Attending Physician Name and Address:  Wyvonnia Dusky, MD  Relative Name and Phone Number:       Current Level of Care: Hospital Recommended Level of Care: [place] Prior Approval Number:    Date Approved/Denied:   PASRR Number: [id number]  Discharge Plan: SNF    Current Diagnoses: Patient Active Problem List   Diagnosis Date Noted  . Frequent falls 05/13/2020  . Anxiety 05/13/2020  . Acute lower UTI 05/13/2020  . Chronic atrial fibrillation ([place]) 12/31/2013  . Tachycardia 12/31/2013  . Personal history of breast cancer 12/31/2013  . Recurrent breast cancer ([place]) 09/18/2012  . Personal history of colonic polyps     Orientation RESPIRATION BLADDER Height & Weight     Self, Time, Situation, Place  Normal Incontinent, External catheter Weight: 134 lb 0.6 oz (60.8 kg) Height:  5\' 3"  (160 cm)  BEHAVIORAL SYMPTOMS/MOOD NEUROLOGICAL BOWEL NUTRITION STATUS   (None)  (None) Continent Diet (2 gram sodium)  AMBULATORY STATUS COMMUNICATION OF NEEDS Skin   Extensive Assist Verbally Bruising                       Personal Care Assistance Level of Assistance  Bathing, Feeding, Dressing Bathing Assistance: Maximum assistance Feeding assistance: Limited assistance Dressing Assistance: Maximum assistance     Functional Limitations Info  Sight, Hearing, Speech Sight Info: Adequate Hearing Info: Adequate Speech Info: Adequate    SPECIAL CARE FACTORS FREQUENCY  PT (By licensed PT), OT (By licensed OT)     PT Frequency: 5 x week OT Frequency: 5 x week            Contractures  Contractures Info: Not present    Additional Factors Info  Code Status, Allergies, Psychotropic Code Status Info: DNR Allergies Info: NKDA Psychotropic Info: Anxiety         Current Medications (05/15/2020):  This is the current hospital active medication list Current Facility-Administered Medications  Medication Dose Route Frequency Provider Last Rate Last Admin  . acetaminophen (TYLENOL) tablet 650 mg  650 mg Oral Q6H PRN Agbata, Tochukwu, MD   650 mg at 05/14/20 0438   Or  . acetaminophen (TYLENOL) suppository 650 mg  650 mg Rectal Q6H PRN Agbata, Tochukwu, MD      . apixaban (ELIQUIS) tablet 2.5 mg  2.5 mg Oral BID Agbata, Tochukwu, MD   2.5 mg at 05/15/20 0805  . brimonidine (ALPHAGAN) 0.2 % ophthalmic solution 1 drop  1 drop Both Eyes BID Agbata, Tochukwu, MD   1 drop at 05/15/20 0803  . diclofenac Sodium (VOLTAREN) 1 % topical gel 2 g  2 g Topical QID Lang Snow, NP   2 g at 05/15/20 0806  . LORazepam (ATIVAN) tablet 1 mg  1 mg Oral Q8H PRN Agbata, Tochukwu, MD      . multivitamin with minerals tablet 1 tablet  1 tablet Oral Daily Agbata, Tochukwu, MD   1 tablet at 05/15/20 0802  . ondansetron (ZOFRAN) tablet 4 mg  4 mg Oral Q6H PRN Agbata, Tochukwu, MD       Or  . ondansetron (ZOFRAN) injection 4 mg  4 mg Intravenous Q6H PRN Agbata, Tochukwu, MD      . piperacillin-tazobactam (ZOSYN) IVPB 3.375 g  3.375 g Intravenous Q8H Agbata, Tochukwu, MD 12.5 mL/hr at 05/15/20 0533 3.375 g at 05/15/20 0533  . potassium chloride 20 MEQ/15ML (10%) solution 10 mEq  10 mEq Oral Daily Lorna Dibble, RPH      . timolol (TIMOPTIC) 0.5 % ophthalmic solution 1 drop  1 drop Both Eyes BID Agbata, Tochukwu, MD   1 drop at 05/15/20 0803  . traMADol (ULTRAM) tablet 50 mg  50 mg Oral BID Agbata, Tochukwu, MD   50 mg at 05/14/20 2042     Discharge Medications: Please see discharge summary for a list of discharge medications.  Relevant Imaging Results:  Relevant Lab  Results:   Additional Information SS#: [id number]. Plan to transition to LTC after rehab.  Candie Chroman, LCSW

## 2020-05-16 DIAGNOSIS — N390 Urinary tract infection, site not specified: Secondary | ICD-10-CM | POA: Diagnosis not present

## 2020-05-16 DIAGNOSIS — R296 Repeated falls: Secondary | ICD-10-CM | POA: Diagnosis not present

## 2020-05-16 DIAGNOSIS — I482 Chronic atrial fibrillation, unspecified: Secondary | ICD-10-CM | POA: Diagnosis not present

## 2020-05-16 LAB — CBC
HCT: 42.8 % (ref 36.0–46.0)
Hemoglobin: 14.2 g/dL (ref 12.0–15.0)
MCH: 30.1 pg (ref 26.0–34.0)
MCHC: 33.2 g/dL (ref 30.0–36.0)
MCV: 90.7 fL (ref 80.0–100.0)
Platelets: 289 10*3/uL (ref 150–400)
RBC: 4.72 MIL/uL (ref 3.87–5.11)
RDW: 13.7 % (ref 11.5–15.5)
WBC: 9.3 10*3/uL (ref 4.0–10.5)
nRBC: 0 % (ref 0.0–0.2)

## 2020-05-16 LAB — BASIC METABOLIC PANEL
Anion gap: 10 (ref 5–15)
BUN: 29 mg/dL — ABNORMAL HIGH (ref 8–23)
CO2: 27 mmol/L (ref 22–32)
Calcium: 8.3 mg/dL — ABNORMAL LOW (ref 8.9–10.3)
Chloride: 103 mmol/L (ref 98–111)
Creatinine, Ser: 0.67 mg/dL (ref 0.44–1.00)
GFR, Estimated: >60 mL/min (ref >60)
Glucose, Bld: 81 mg/dL (ref 70–99)
Potassium: 3.6 mmol/L (ref 3.5–5.1)
Sodium: 140 mmol/L (ref 135–145)

## 2020-05-16 MED ORDER — INFLUENZA VAC A&B SA ADJ QUAD 0.5 ML IM PRSY
0.5000 mL | PREFILLED_SYRINGE | Freq: Once | INTRAMUSCULAR | Status: AC
  Administered 2020-05-16: 0.5 mL via INTRAMUSCULAR
  Filled 2020-05-16: qty 0.5

## 2020-05-16 MED ORDER — CEFDINIR 300 MG PO CAPS: 300.0000 mg | ORAL_CAPSULE | Freq: Two times a day (BID) | ORAL | 0 refills | Status: AC

## 2020-05-16 NOTE — Plan of Care (Signed)

## 2020-05-16 NOTE — Care Plan (Addendum)
Report called to [facility] at this time, [facility].   Update: EMS arrived and pt off unit in stable condition. Sent with clothing, glasses, dentures in mouth, shoes, banana and tissues and cough drops.

## 2020-05-16 NOTE — Care Management Important Message (Signed)
Important Message  Patient Details  Name: Mindy Garza MRN: [id number] Date of Birth: 30-Sep-1925   Medicare Important Message Given:  Yes     Dannette Barbara 05/16/2020, 11:22 AM

## 2020-05-16 NOTE — Discharge Summary (Signed)
Physician Discharge Summary  Mindy Garza YCX:[id number] DOB: 09-Dec-1925 DOA: 05/13/2020  PCP: Mindy Crouch, MD  Admit date: 05/13/2020 Discharge date: 05/16/2020  Admitted From: home Disposition: [facility]  Recommendations for Outpatient Follow-up:  1. Follow up with PCP in 1 week  Home Health: no Equipment/Devices:  Discharge Condition: stable CODE STATUS: DNR Diet recommendation: Heart Healthy   Brief/Interim Summary: HPI was taken from Mindy Garza: Mindy Garza is a 86 y.o. female with medical history significant for A. fib on anticoagulation, history of osteoarthritis hypertension and osteoporosis who was brought into the ER for the second time in the last 24 hours for evaluation of fall.  Patient lives at home with her son who is unable to care for her.  Her daughter who is with her in the ER provides most of the history and states that she has had multiple falls over the last couple of days and was seen in the ER 24 hours prior to admission for evaluation of pain in her right knee following a fall.  Patient uses an assistive device and ambulates with a walker but despite this she continues to fall and her daughter states that her knees just give out on her.  She was discharged home with pain medication and her daughter states that she was unable to get out of the car when they got home and had a fall this morning prompting a repeat visit to the ER. She complains of pain in her left knee which she thinks is secondary to her known chronic arthritis.  She complains of urinary frequency and some dysuria as well as constipation and states that she had taken Imodium 2 days prior because of diarrhea. She denies having any chest pain, no shortness of breath, no dizziness, no lightheadedness, no nausea, no vomiting, no fever, no chills, no cough Labs show sodium 139, potassium 3.9, chloride 103, bicarb 25, glucose 149, BUN 25, creatinine 0.74, calcium 8.9, alkaline phosphatase 87,  albumin 3.8, AST 29, ALT 21, total protein 7.6, troponin 20, white count 12.6, hemoglobin 15.1, hematocrit 46.2, MCV 93, RDW 14.3, platelet count 218 Left knee x-ray shows tricompartment degenerative change, most prominent about the medial compartment.  Chondrocalcinosis.  No acute bony abnormality. Right knee x-ray shows mild to moderate osteoarthritic change and small joint effusion. CT scan of the head without contrast shows no evidence of intracranial injury.  Brain atrophy. Twelve-lead EKG shows atrial flutter   ED Course: Patient is a 86 year old Caucasian female who was brought into the ER for evaluation of frequent falls at home.  She is a high risk for developing fracture following this falls.  She is also noted to have a urinary tract infection and will be admitted to the hospital for further evaluation  Hospital Course from Dr. Lenise Garza 11/17-11/19/21: Pt presented after a fall at home. PT/OT saw the pt and recommended SNF. Of note, pt was found to have UTI secondary to e.coli. Pt received IV zosyn while inpatient and d/c on po cefdinir as urine cx results. For more information please see previous progress/consult notes.   Discharge Diagnoses:  Principal Problem:   Frequent falls Active Problems:   Chronic atrial fibrillation (HCC)   Anxiety   Acute lower UTI  Frequent falls: PT/OT recs SNF. Pt is agreeable   UTI: UA is positive, urine cx is growing e.coli and will switch to po cefdinir at d/c. Continue on IV zosyn while inpatient only  Leukocytosis: resolved  Hypokalemia: WNL today.  Anxiety: severity unknown. Lorazepam prn   Chronic a. fib: not on any rate controlling meds. Continue on eliquis   B/l knee pain: secondary to OA. Continue on tramadol prn    Discharge Instructions  Discharge Instructions    Diet - low sodium heart healthy   Complete by: As directed    Discharge instructions   Complete by: As directed    F/u w/ PCP in 1 week   Increase  activity slowly   Complete by: As directed      Allergies as of 05/16/2020   No Known Allergies     Medication List    TAKE these medications   brimonidine 0.2 % ophthalmic solution Commonly known as: ALPHAGAN Place 1 drop into both eyes 2 (two) times daily.   CALCIUM 500/VITAMIN D PO Take 1,500 mg by mouth daily.   cefdinir 300 MG capsule Commonly known as: OMNICEF Take 1 capsule (300 mg total) by mouth 2 (two) times daily for 4 days.   Combigan 0.2-0.5 % ophthalmic solution Generic drug: brimonidine-timolol Place 1 drop into both eyes once.   Eliquis 2.5 MG Tabs tablet Generic drug: apixaban Take 2.5 mg by mouth 2 (two) times daily.   fish oil-omega-3 fatty acids 1000 MG capsule Take 2 g by mouth daily.   LORazepam 1 MG tablet Commonly known as: ATIVAN Take 1 mg by mouth every 8 (eight) hours.   multivitamin tablet Take 1 tablet by mouth daily.   potassium chloride SA 20 MEQ tablet Commonly known as: KLOR-CON Take 10 mEq by mouth daily. 1/2 tab once daily   timolol 0.5 % ophthalmic solution Commonly known as: TIMOPTIC Place 1 drop into both eyes 2 (two) times daily.   traMADol 50 MG tablet Commonly known as: Ultram Take 1 tablet (50 mg total) by mouth 2 (two) times daily for 5 days.   vitamin E 1000 UNIT capsule Take 1,000 Units by mouth daily.       Contact information for after-discharge care    Destination    [facility] .   Service: Skilled Nursing Contact information: [address] [place] [phone]                 No Known Allergies  Consultations:     Procedures/Studies: DG Knee 2 Views Left  Result Date: 05/13/2020 CLINICAL DATA:  Fall. EXAM: LEFT KNEE - 1-2 VIEW COMPARISON:  No prior. FINDINGS: No evidence of effusion. Tricompartment degenerative change, most prominent about the medial compartment noted. Chondrocalcinosis. No acute bony abnormality. No evidence of  fracture dislocation. Peripheral vascular calcification. IMPRESSION: 1. Tricompartment degenerative change, most prominent about the medial compartment. Chondrocalcinosis. No acute bony abnormality. 2. Peripheral vascular disease. Electronically Signed   By: Mindy Garza  Register   On: 05/13/2020 10:41   DG Knee 1-2 Views Right  Result Date: 05/12/2020 CLINICAL DATA:  Right knee pain and swelling. EXAM: RIGHT KNEE - 1-2 VIEW COMPARISON:  None. FINDINGS: Mild-to-moderate osteoarthritic change most prominent over the patellofemoral joint and medial compartments. Suggestion of a small joint effusion. No evidence of fracture or dislocation. IMPRESSION: 1. No acute findings. 2. Mild-to-moderate osteoarthritic change and small joint effusion. Electronically Signed   By: Marin Olp M.D.   On: 05/12/2020 11:13   CT Head Wo Contrast  Result Date: 05/13/2020 CLINICAL DATA:  Minor head trauma EXAM: CT HEAD WITHOUT CONTRAST TECHNIQUE: Contiguous axial images were obtained from the base of the skull through the vertex without intravenous  contrast. COMPARISON:  05/01/2014 FINDINGS: Brain: No evidence of acute infarction, hemorrhage, hydrocephalus, extra-axial collection or mass lesion/mass effect. Brain atrophy most notable at the anterior temporal lobes and at the occipital parietal sulci. Mild for age chronic white matter disease. Vascular: No hyperdense vessel or unexpected calcification. Skull: No acute fracture Sinuses/Orbits: No evidence of injury. Bilateral cataract resection. IMPRESSION: No evidence of intracranial injury. Brain atrophy. Electronically Signed   By: Monte Fantasia M.D.   On: 05/13/2020 10:28      Subjective: Pt c/o fatigue    Discharge Exam: Vitals:   05/16/20 0435 05/16/20 0747  BP: (!) 157/94 (!) 145/90  Pulse: 80 83  Resp: 16 20  Temp: 97.7 F (36.5 C) 98.6 F (37 C)  SpO2: 94% 94%   Vitals:   05/15/20 1939 05/15/20 2311 05/16/20 0435 05/16/20 0747  BP: (!) 138/99 (!)  149/82 (!) 157/94 (!) 145/90  Pulse: 78 75 80 83  Resp: 16 16 16 20   Temp: 98.3 F (36.8 C) 98.2 F (36.8 C) 97.7 F (36.5 C) 98.6 F (37 C)  TempSrc: Oral Oral Oral Oral  SpO2: 95% 93% 94% 94%  Weight:      Height:        General: Pt is alert, awake, not in acute distress Cardiovascular: irregularly irregular, no rubs, no gallops Respiratory: CTA bilaterally, no wheezing, no rhonchi Abdominal: Soft, NT, ND, bowel sounds + Extremities:  no cyanosis    The results of significant diagnostics from this hospitalization (including imaging, microbiology, ancillary and laboratory) are listed below for reference.     Microbiology: Recent Results (from the past 240 hour(s))  Urine culture     Status: Abnormal   Collection Time: 05/13/20 12:22 PM   Specimen: Urine, Clean Catch  Result Value Ref Range Status   Specimen Description   Final    URINE, CLEAN CATCH Performed at [facility], [address], [place], [place] [address]    Special Requests   Final    NONE Performed at [facility], [place], [place] [address]    Culture >=100,000 COLONIES/mL ESCHERICHIA COLI (A)  Final   Report Status 05/15/2020 FINAL  Final   Organism ID, Bacteria ESCHERICHIA COLI (A)  Final      Susceptibility   Escherichia coli - MIC*    AMPICILLIN >=32 RESISTANT Resistant     CEFAZOLIN <=4 SENSITIVE Sensitive     CEFEPIME <=0.12 SENSITIVE Sensitive     CEFTRIAXONE <=0.25 SENSITIVE Sensitive     CIPROFLOXACIN >=4 RESISTANT Resistant     GENTAMICIN >=16 RESISTANT Resistant     IMIPENEM <=0.25 SENSITIVE Sensitive     NITROFURANTOIN <=16 SENSITIVE Sensitive     TRIMETH/SULFA >=320 RESISTANT Resistant     AMPICILLIN/SULBACTAM >=32 RESISTANT Resistant     PIP/TAZO <=4 SENSITIVE Sensitive     * >=100,000 COLONIES/mL ESCHERICHIA COLI  Respiratory Panel by RT PCR (Flu A&B, Covid) - Nasopharyngeal Swab     Status: None   Collection Time: 05/13/20 12:58 PM    Specimen: Nasopharyngeal Swab  Result Value Ref Range Status   SARS Coronavirus 2 by RT PCR NEGATIVE NEGATIVE Final    Comment: (NOTE) SARS-CoV-2 target nucleic acids are NOT DETECTED.  The SARS-CoV-2 RNA is generally detectable in upper respiratoy specimens during the acute phase of infection. The lowest concentration of SARS-CoV-2 viral copies this assay can detect is 131 copies/mL. A negative result does not preclude SARS-Cov-2 infection and should not be used as the  sole basis for treatment or other patient management decisions. A negative result may occur with  improper specimen collection/handling, submission of specimen other than nasopharyngeal swab, presence of viral mutation(s) within the areas targeted by this assay, and inadequate number of viral copies (<131 copies/mL). A negative result must be combined with clinical observations, patient history, and epidemiological information. The expected result is Negative.  Fact Sheet for Patients:  PinkCheek.be  Fact Sheet for Healthcare Providers:  GravelBags.it  This test is no t yet approved or cleared by the [place] FDA and  has been authorized for detection and/or diagnosis of SARS-CoV-2 by FDA under an Emergency Use Authorization (EUA). This EUA will remain  in effect (meaning this test can be used) for the duration of the COVID-19 declaration under Section 564(b)(1) of the Act, 21 U.S.C. section 360bbb-3(b)(1), unless the authorization is terminated or revoked sooner.     Influenza A by PCR NEGATIVE NEGATIVE Final   Influenza B by PCR NEGATIVE NEGATIVE Final    Comment: (NOTE) The Xpert Xpress SARS-CoV-2/FLU/RSV assay is intended as an aid in  the diagnosis of influenza from Nasopharyngeal swab specimens and  should not be used as a sole basis for treatment. Nasal washings and  aspirates are unacceptable for Xpert Xpress SARS-CoV-2/FLU/RSV   testing.  Fact Sheet for Patients: PinkCheek.be  Fact Sheet for Healthcare Providers: GravelBags.it  This test is not yet approved or cleared by the [place] FDA and  has been authorized for detection and/or diagnosis of SARS-CoV-2 by  FDA under an Emergency Use Authorization (EUA). This EUA will remain  in effect (meaning this test can be used) for the duration of the  Covid-19 declaration under Section 564(b)(1) of the Act, 21  U.S.C. section 360bbb-3(b)(1), unless the authorization is  terminated or revoked. Performed at [facility], [address] [place], [place] [address]   Resp Panel by RT-PCR (Flu A&B, Covid) Nasopharyngeal Swab     Status: None   Collection Time: 05/15/20 12:09 PM   Specimen: Nasopharyngeal Swab; Nasopharyngeal(NP) swabs in vial transport medium  Result Value Ref Range Status   SARS Coronavirus 2 by RT PCR NEGATIVE NEGATIVE Final    Comment: (NOTE) SARS-CoV-2 target nucleic acids are NOT DETECTED.  The SARS-CoV-2 RNA is generally detectable in upper respiratory specimens during the acute phase of infection. The lowest concentration of SARS-CoV-2 viral copies this assay can detect is 138 copies/mL. A negative result does not preclude SARS-Cov-2 infection and should not be used as the sole basis for treatment or other patient management decisions. A negative result may occur with  improper specimen collection/handling, submission of specimen other than nasopharyngeal swab, presence of viral mutation(s) within the areas targeted by this assay, and inadequate number of viral copies(<138 copies/mL). A negative result must be combined with clinical observations, patient history, and epidemiological information. The expected result is Negative.  Fact Sheet for Patients:  EntrepreneurPulse.com.au  Fact Sheet for Healthcare Providers:   IncredibleEmployment.be  This test is no t yet approved or cleared by the [place] FDA and  has been authorized for detection and/or diagnosis of SARS-CoV-2 by FDA under an Emergency Use Authorization (EUA). This EUA will remain  in effect (meaning this test can be used) for the duration of the COVID-19 declaration under Section 564(b)(1) of the Act, 21 U.S.C.section 360bbb-3(b)(1), unless the authorization is terminated  or revoked sooner.       Influenza A by PCR NEGATIVE NEGATIVE Final   Influenza B by  PCR NEGATIVE NEGATIVE Final    Comment: (NOTE) The Xpert Xpress SARS-CoV-2/FLU/RSV plus assay is intended as an aid in the diagnosis of influenza from Nasopharyngeal swab specimens and should not be used as a sole basis for treatment. Nasal washings and aspirates are unacceptable for Xpert Xpress SARS-CoV-2/FLU/RSV testing.  Fact Sheet for Patients: EntrepreneurPulse.com.au  Fact Sheet for Healthcare Providers: IncredibleEmployment.be  This test is not yet approved or cleared by the [place] FDA and has been authorized for detection and/or diagnosis of SARS-CoV-2 by FDA under an Emergency Use Authorization (EUA). This EUA will remain in effect (meaning this test can be used) for the duration of the COVID-19 declaration under Section 564(b)(1) of the Act, 21 U.S.C. section 360bbb-3(b)(1), unless the authorization is terminated or revoked.  Performed at [facility], [address] [place], [place] [address]      Labs: BNP (last 3 results) No results for input(s): BNP in the last 8760 hours. Basic Metabolic Panel: Recent Labs  Lab 05/13/20 0941 05/14/20 0507 05/15/20 0530 05/16/20 0452  NA 139 140 141 140  K 3.9 3.3* 3.7 3.6  CL 103 103 103 103  CO2 25 27 25 27   GLUCOSE 149* 121* 99 81  BUN 25* 28* 33* 29*  CREATININE 0.74 0.72 0.85 0.67  CALCIUM 8.9 8.4* 8.5* 8.3*   Liver Function  Tests: Recent Labs  Lab 05/13/20 0941  AST 29  ALT 21  ALKPHOS 87  BILITOT 1.4*  PROT 7.6  ALBUMIN 3.8   No results for input(s): LIPASE, AMYLASE in the last 168 hours. No results for input(s): AMMONIA in the last 168 hours. CBC: Recent Labs  Lab 05/13/20 0941 05/14/20 0507 05/15/20 0530 05/16/20 0452  WBC 12.6* 15.8* 12.4* 9.3  NEUTROABS 10.5*  --   --   --   HGB 15.1* 13.1 13.1 14.2  HCT 46.2* 40.0 39.1 42.8  MCV 93.0 92.8 91.4 90.7  PLT 218 244 249 289   Cardiac Enzymes: No results for input(s): CKTOTAL, CKMB, CKMBINDEX, TROPONINI in the last 168 hours. BNP: Invalid input(s): POCBNP CBG: No results for input(s): GLUCAP in the last 168 hours. D-Dimer No results for input(s): DDIMER in the last 72 hours. Hgb A1c No results for input(s): HGBA1C in the last 72 hours. Lipid Profile No results for input(s): CHOL, HDL, LDLCALC, TRIG, CHOLHDL, LDLDIRECT in the last 72 hours. Thyroid function studies No results for input(s): TSH, T4TOTAL, T3FREE, THYROIDAB in the last 72 hours.  Invalid input(s): FREET3 Anemia work up No results for input(s): VITAMINB12, FOLATE, FERRITIN, TIBC, IRON, RETICCTPCT in the last 72 hours. Urinalysis    Component Value Date/Time   COLORURINE YELLOW (A) 05/13/2020 1222   APPEARANCEUR CLOUDY (A) 05/13/2020 1222   APPEARANCEUR Hazy 06/25/2014 1944   LABSPEC 1.017 05/13/2020 1222   LABSPEC 1.008 06/25/2014 1944   PHURINE 5.0 05/13/2020 1222   GLUCOSEU NEGATIVE 05/13/2020 1222   GLUCOSEU Negative 06/25/2014 1944   HGBUR SMALL (A) 05/13/2020 1222   BILIRUBINUR NEGATIVE 05/13/2020 1222   BILIRUBINUR Negative 06/25/2014 1944   KETONESUR 20 (A) 05/13/2020 1222   PROTEINUR 30 (A) 05/13/2020 1222   NITRITE NEGATIVE 05/13/2020 1222   LEUKOCYTESUR LARGE (A) 05/13/2020 1222   LEUKOCYTESUR 1+ 06/25/2014 1944   Sepsis Labs Invalid input(s): PROCALCITONIN,  WBC,  LACTICIDVEN Microbiology Recent Results (from the past 240 hour(s))  Urine culture      Status: Abnormal   Collection Time: 05/13/20 12:22 PM   Specimen: Urine, Clean Catch  Result Value  Ref Range Status   Specimen Description   Final    URINE, CLEAN CATCH Performed at [facility], [address] [place], [place] [address]    Special Requests   Final    NONE Performed at [facility], [address] [place], [place] [address]    Culture >=100,000 COLONIES/mL ESCHERICHIA COLI (A)  Final   Report Status 05/15/2020 FINAL  Final   Organism ID, Bacteria ESCHERICHIA COLI (A)  Final      Susceptibility   Escherichia coli - MIC*    AMPICILLIN >=32 RESISTANT Resistant     CEFAZOLIN <=4 SENSITIVE Sensitive     CEFEPIME <=0.12 SENSITIVE Sensitive     CEFTRIAXONE <=0.25 SENSITIVE Sensitive     CIPROFLOXACIN >=4 RESISTANT Resistant     GENTAMICIN >=16 RESISTANT Resistant     IMIPENEM <=0.25 SENSITIVE Sensitive     NITROFURANTOIN <=16 SENSITIVE Sensitive     TRIMETH/SULFA >=320 RESISTANT Resistant     AMPICILLIN/SULBACTAM >=32 RESISTANT Resistant     PIP/TAZO <=4 SENSITIVE Sensitive     * >=100,000 COLONIES/mL ESCHERICHIA COLI  Respiratory Panel by RT PCR (Flu A&B, Covid) - Nasopharyngeal Swab     Status: None   Collection Time: 05/13/20 12:58 PM   Specimen: Nasopharyngeal Swab  Result Value Ref Range Status   SARS Coronavirus 2 by RT PCR NEGATIVE NEGATIVE Final    Comment: (NOTE) SARS-CoV-2 target nucleic acids are NOT DETECTED.  The SARS-CoV-2 RNA is generally detectable in upper respiratoy specimens during the acute phase of infection. The lowest concentration of SARS-CoV-2 viral copies this assay can detect is 131 copies/mL. A negative result does not preclude SARS-Cov-2 infection and should not be used as the sole basis for treatment or other patient management decisions. A negative result may occur with  improper specimen collection/handling, submission of specimen other than nasopharyngeal swab, presence of viral mutation(s) within  the areas targeted by this assay, and inadequate number of viral copies (<131 copies/mL). A negative result must be combined with clinical observations, patient history, and epidemiological information. The expected result is Negative.  Fact Sheet for Patients:  PinkCheek.be  Fact Sheet for Healthcare Providers:  GravelBags.it  This test is no t yet approved or cleared by the [place] FDA and  has been authorized for detection and/or diagnosis of SARS-CoV-2 by FDA under an Emergency Use Authorization (EUA). This EUA will remain  in effect (meaning this test can be used) for the duration of the COVID-19 declaration under Section 564(b)(1) of the Act, 21 U.S.C. section 360bbb-3(b)(1), unless the authorization is terminated or revoked sooner.     Influenza A by PCR NEGATIVE NEGATIVE Final   Influenza B by PCR NEGATIVE NEGATIVE Final    Comment: (NOTE) The Xpert Xpress SARS-CoV-2/FLU/RSV assay is intended as an aid in  the diagnosis of influenza from Nasopharyngeal swab specimens and  should not be used as a sole basis for treatment. Nasal washings and  aspirates are unacceptable for Xpert Xpress SARS-CoV-2/FLU/RSV  testing.  Fact Sheet for Patients: PinkCheek.be  Fact Sheet for Healthcare Providers: GravelBags.it  This test is not yet approved or cleared by the [place] FDA and  has been authorized for detection and/or diagnosis of SARS-CoV-2 by  FDA under an Emergency Use Authorization (EUA). This EUA will remain  in effect (meaning this test can be used) for the duration of the  Covid-19 declaration under Section 564(b)(1) of the Act, 21  U.S.C. section 360bbb-3(b)(1), unless the authorization is  terminated or revoked. Performed at [facility], [address] [place], [place] [address]   Resp Panel by RT-PCR (Flu A&B, Covid)  Nasopharyngeal Swab     Status: None   Collection Time: 05/15/20 12:09 PM   Specimen: Nasopharyngeal Swab; Nasopharyngeal(NP) swabs in vial transport medium  Result Value Ref Range Status   SARS Coronavirus 2 by RT PCR NEGATIVE NEGATIVE Final    Comment: (NOTE) SARS-CoV-2 target nucleic acids are NOT DETECTED.  The SARS-CoV-2 RNA is generally detectable in upper respiratory specimens during the acute phase of infection. The lowest concentration of SARS-CoV-2 viral copies this assay can detect is 138 copies/mL. A negative result does not preclude SARS-Cov-2 infection and should not be used as the sole basis for treatment or other patient management decisions. A negative result may occur with  improper specimen collection/handling, submission of specimen other than nasopharyngeal swab, presence of viral mutation(s) within the areas targeted by this assay, and inadequate number of viral copies(<138 copies/mL). A negative result must be combined with clinical observations, patient history, and epidemiological information. The expected result is Negative.  Fact Sheet for Patients:  EntrepreneurPulse.com.au  Fact Sheet for Healthcare Providers:  IncredibleEmployment.be  This test is no t yet approved or cleared by the [place] FDA and  has been authorized for detection and/or diagnosis of SARS-CoV-2 by FDA under an Emergency Use Authorization (EUA). This EUA will remain  in effect (meaning this test can be used) for the duration of the COVID-19 declaration under Section 564(b)(1) of the Act, 21 U.S.C.section 360bbb-3(b)(1), unless the authorization is terminated  or revoked sooner.       Influenza A by PCR NEGATIVE NEGATIVE Final   Influenza B by PCR NEGATIVE NEGATIVE Final    Comment: (NOTE) The Xpert Xpress SARS-CoV-2/FLU/RSV plus assay is intended as an aid in the diagnosis of influenza from Nasopharyngeal swab specimens and should not be  used as a sole basis for treatment. Nasal washings and aspirates are unacceptable for Xpert Xpress SARS-CoV-2/FLU/RSV testing.  Fact Sheet for Patients: EntrepreneurPulse.com.au  Fact Sheet for Healthcare Providers: IncredibleEmployment.be  This test is not yet approved or cleared by the [place] FDA and has been authorized for detection and/or diagnosis of SARS-CoV-2 by FDA under an Emergency Use Authorization (EUA). This EUA will remain in effect (meaning this test can be used) for the duration of the COVID-19 declaration under Section 564(b)(1) of the Act, 21 U.S.C. section 360bbb-3(b)(1), unless the authorization is terminated or revoked.  Performed at [facility], [address], [place], [place] [address]      Time coordinating discharge: Over 30 minutes  SIGNED:   Wyvonnia Dusky, MD  [facility] 05/16/2020, 11:35 AM Pager   If 7PM-7AM, please contact night-coverage [email or web address]

## 2021-09-30 IMAGING — CT CT HEAD W/O CM
4 series · 14 of 47 positions shown, 16 images · non-contrast
Comparison: 05/01/2014

CLINICAL DATA: Minor head trauma

EXAM:
CT HEAD WITHOUT CONTRAST
TECHNIQUE: Contiguous axial images were obtained from the base of the skull
through the vertex without intravenous contrast.

[Series 2: head bone · axial · 0.43mm/px · z∈[-149,-135]mm · 2 of 77 slices shown]
[im 8/77  bone]
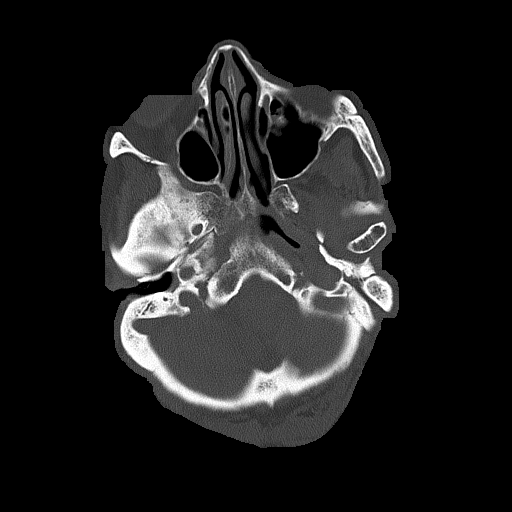
[im 15/77  bone]
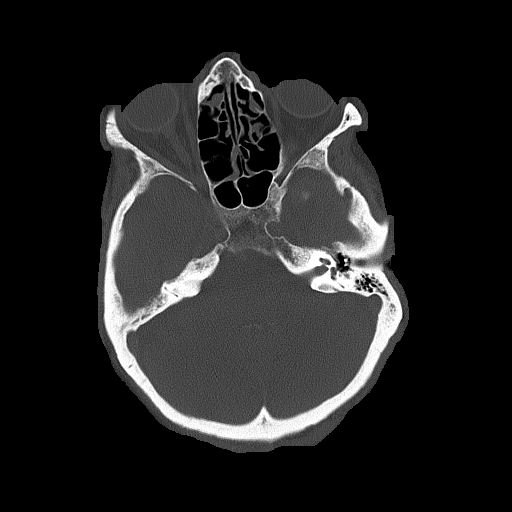

[Series 3: ax head wo · axial · 0.31mm/px · z∈[-127,-31]mm · 6 of 30 slices shown, 8 images]
[im 5/30  brain]
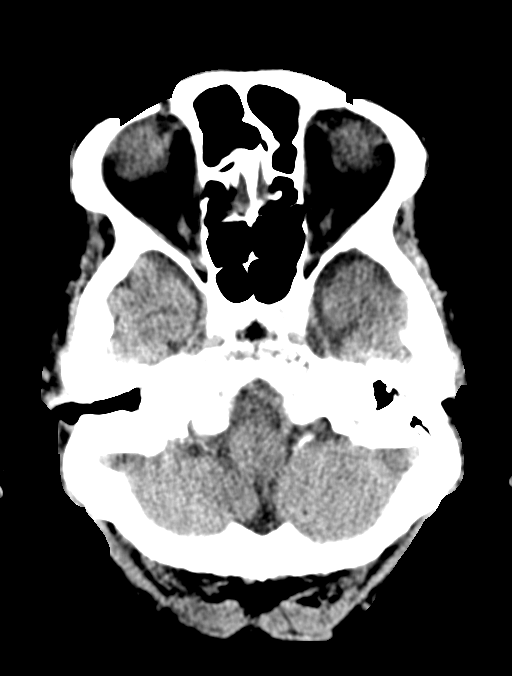
[im 5/30  bone]
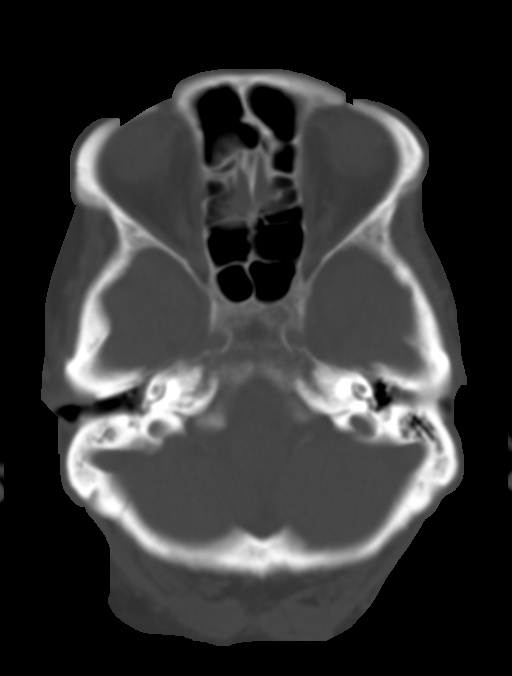
[im 9/30  brain]
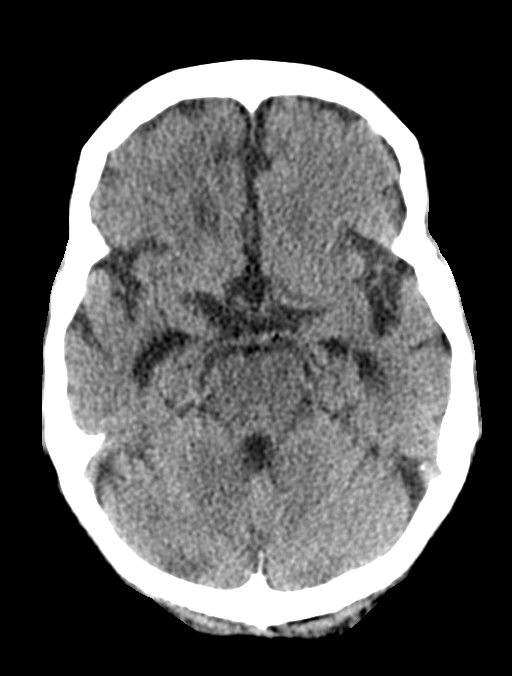
[im 13/30  brain]
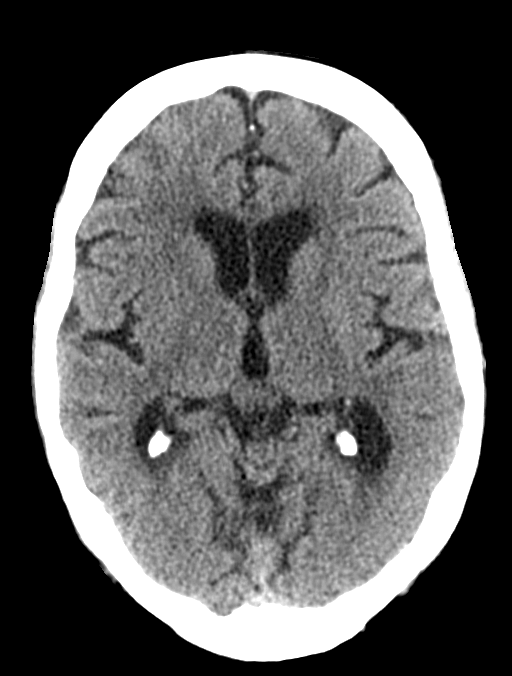
[im 17/30  brain]
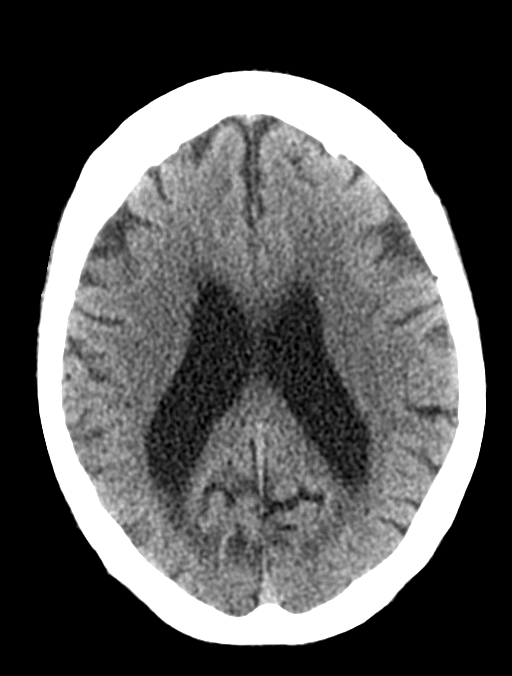
[im 21/30  brain]
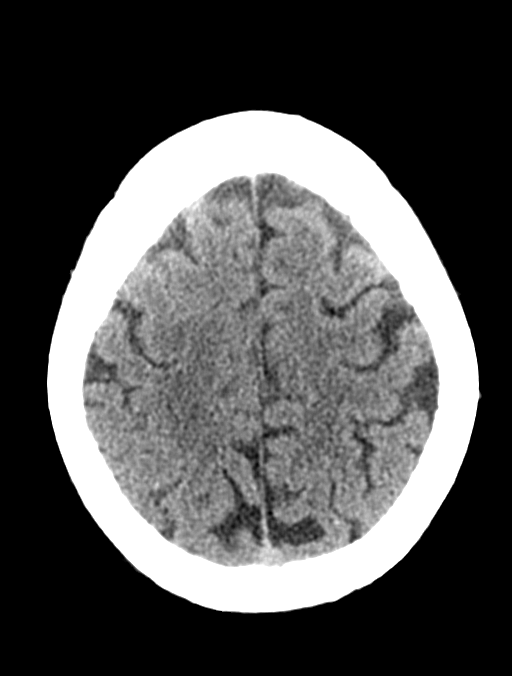
[im 21/30  bone]
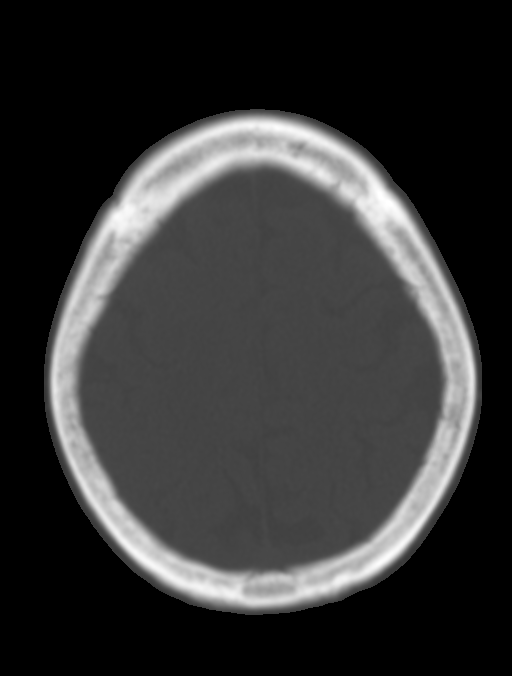
[im 25/30  brain]
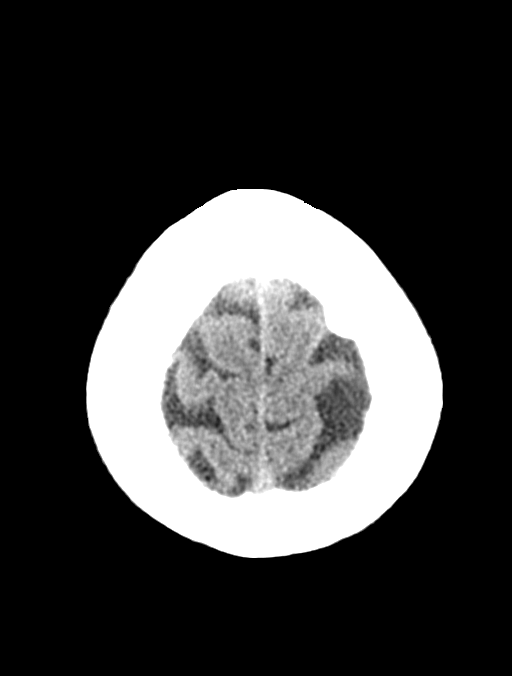

[Series 4: coronal soft tissue · coronal · 0.29mm/px · 3 of 71 slices shown]
[im 24/71  brain]
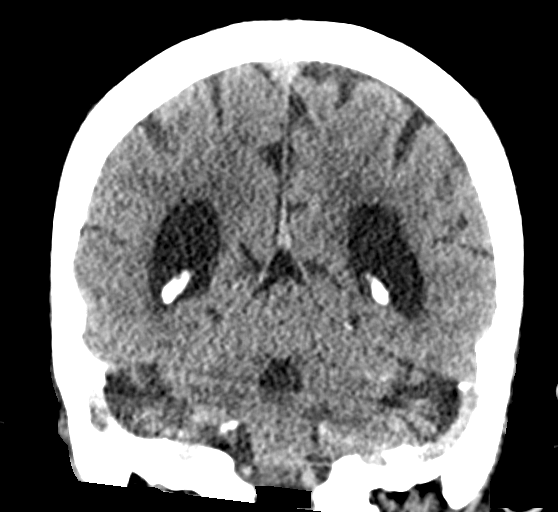
[im 32/71  brain]
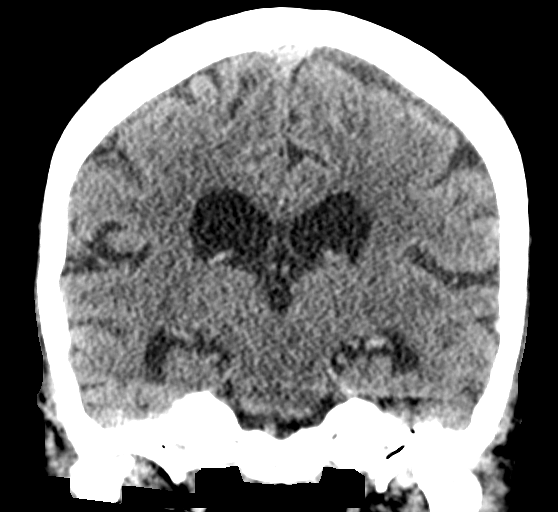
[im 39/71  brain]
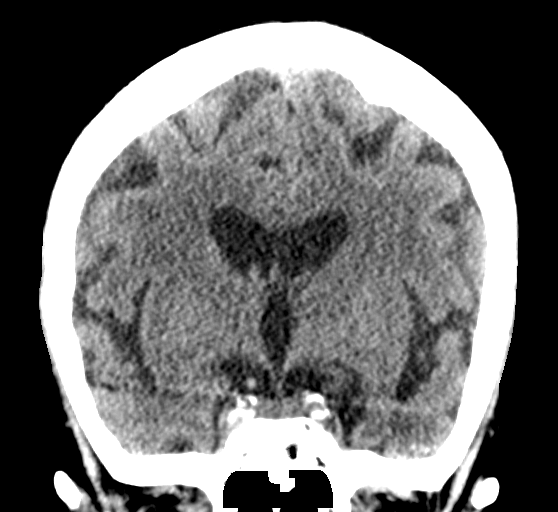

[Series 5: sagittal soft tissue · sagittal · 0.29mm/px · 3 of 54 slices shown]
[im 19/54  brain]
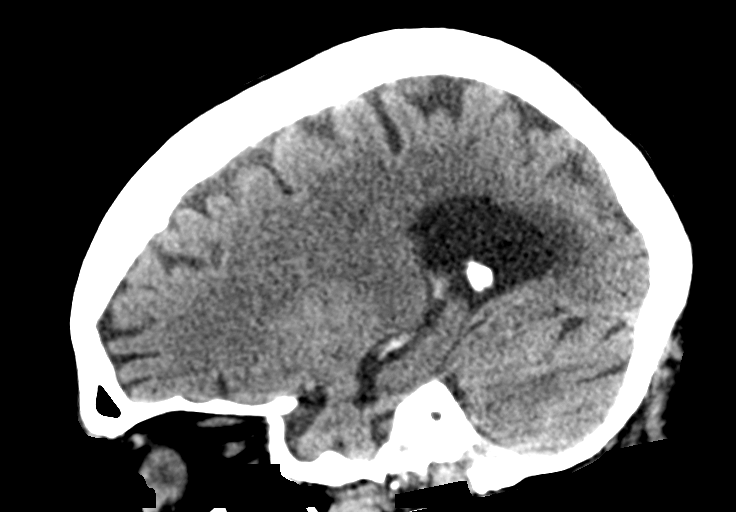
[im 27/54  brain]
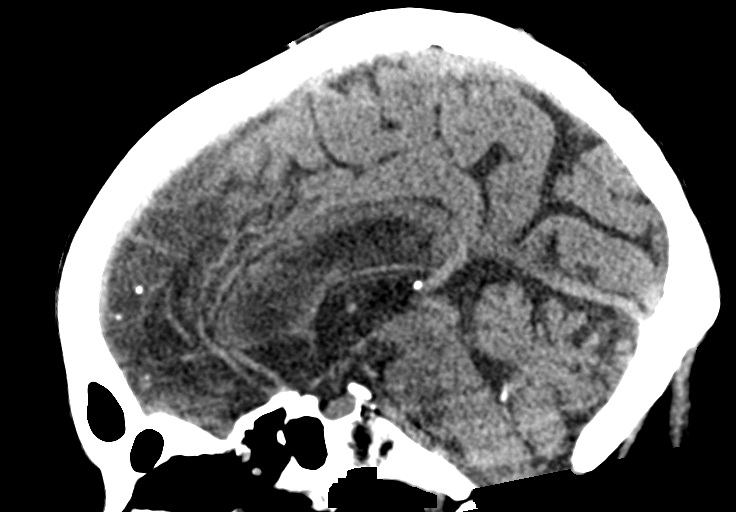
[im 36/54  brain]
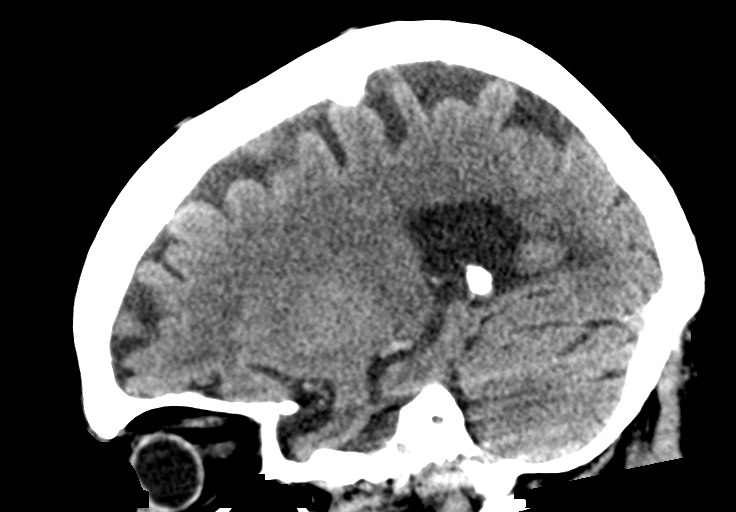

[14 of 47 positions shown; findings below may reference images not displayed]

FINDINGS: Brain: No evidence of acute infarction, hemorrhage, hydrocephalus,
extra-axial collection or mass lesion/mass effect. Brain atrophy
most notable at the anterior temporal lobes and at the occipital
parietal sulci. Mild for age chronic white matter disease.

Vascular: No hyperdense vessel or unexpected calcification.

Skull: No acute fracture

Sinuses/Orbits: No evidence of injury. Bilateral cataract resection.
IMPRESSION: No evidence of intracranial injury.

Brain atrophy.

## 2022-10-16 ENCOUNTER — Inpatient Hospital Stay
Admission: EM | Admit: 2022-10-16 | Discharge: 2022-10-21 | DRG: 871 | Disposition: A | Discharge status: Skilled Nursing Facility | Payer: Medicare Other | Source: Skilled Nursing Facility | Attending: Student in an Organized Health Care Education/Training Program | Admitting: Student in an Organized Health Care Education/Training Program

## 2022-10-16 ENCOUNTER — Encounter: Payer: Self-pay | Admitting: Internal Medicine

## 2022-10-16 ENCOUNTER — Emergency Department: Payer: Medicare Other

## 2022-10-16 ENCOUNTER — Other Ambulatory Visit: Payer: Self-pay

## 2022-10-16 DIAGNOSIS — J9611 Chronic respiratory failure with hypoxia: Secondary | ICD-10-CM | POA: Diagnosis present

## 2022-10-16 DIAGNOSIS — Z853 Personal history of malignant neoplasm of breast: Secondary | ICD-10-CM

## 2022-10-16 DIAGNOSIS — I10 Essential (primary) hypertension: Secondary | ICD-10-CM | POA: Diagnosis present

## 2022-10-16 DIAGNOSIS — F0394 Unspecified dementia, unspecified severity, with anxiety: Secondary | ICD-10-CM | POA: Diagnosis present

## 2022-10-16 DIAGNOSIS — Z515 Encounter for palliative care: Secondary | ICD-10-CM | POA: Diagnosis not present

## 2022-10-16 DIAGNOSIS — Z803 Family history of malignant neoplasm of breast: Secondary | ICD-10-CM

## 2022-10-16 DIAGNOSIS — R0602 Shortness of breath: Secondary | ICD-10-CM | POA: Diagnosis present

## 2022-10-16 DIAGNOSIS — J189 Pneumonia, unspecified organism: Secondary | ICD-10-CM | POA: Diagnosis present

## 2022-10-16 DIAGNOSIS — Z9981 Dependence on supplemental oxygen: Secondary | ICD-10-CM | POA: Diagnosis not present

## 2022-10-16 DIAGNOSIS — A419 Sepsis, unspecified organism: Principal | ICD-10-CM | POA: Diagnosis present

## 2022-10-16 DIAGNOSIS — I4821 Permanent atrial fibrillation: Secondary | ICD-10-CM | POA: Diagnosis present

## 2022-10-16 DIAGNOSIS — Z1152 Encounter for screening for COVID-19: Secondary | ICD-10-CM

## 2022-10-16 DIAGNOSIS — Z9011 Acquired absence of right breast and nipple: Secondary | ICD-10-CM | POA: Diagnosis not present

## 2022-10-16 DIAGNOSIS — F419 Anxiety disorder, unspecified: Secondary | ICD-10-CM | POA: Diagnosis present

## 2022-10-16 DIAGNOSIS — I482 Chronic atrial fibrillation, unspecified: Secondary | ICD-10-CM | POA: Diagnosis present

## 2022-10-16 DIAGNOSIS — Z85828 Personal history of other malignant neoplasm of skin: Secondary | ICD-10-CM

## 2022-10-16 DIAGNOSIS — I2489 Other forms of acute ischemic heart disease: Secondary | ICD-10-CM | POA: Diagnosis present

## 2022-10-16 DIAGNOSIS — R000 Tachycardia, unspecified: Secondary | ICD-10-CM | POA: Diagnosis present

## 2022-10-16 DIAGNOSIS — R54 Age-related physical debility: Secondary | ICD-10-CM | POA: Diagnosis present

## 2022-10-16 DIAGNOSIS — Z7901 Long term (current) use of anticoagulants: Secondary | ICD-10-CM

## 2022-10-16 DIAGNOSIS — M810 Age-related osteoporosis without current pathological fracture: Secondary | ICD-10-CM | POA: Diagnosis present

## 2022-10-16 DIAGNOSIS — Z923 Personal history of irradiation: Secondary | ICD-10-CM | POA: Diagnosis not present

## 2022-10-16 DIAGNOSIS — R0600 Dyspnea, unspecified: Secondary | ICD-10-CM | POA: Diagnosis present

## 2022-10-16 DIAGNOSIS — Z66 Do not resuscitate: Secondary | ICD-10-CM | POA: Diagnosis present

## 2022-10-16 DIAGNOSIS — Z79899 Other long term (current) drug therapy: Secondary | ICD-10-CM | POA: Diagnosis not present

## 2022-10-16 DIAGNOSIS — R296 Repeated falls: Secondary | ICD-10-CM | POA: Diagnosis present

## 2022-10-16 DIAGNOSIS — E872 Acidosis, unspecified: Secondary | ICD-10-CM | POA: Diagnosis present

## 2022-10-16 DIAGNOSIS — G8929 Other chronic pain: Secondary | ICD-10-CM | POA: Diagnosis present

## 2022-10-16 DIAGNOSIS — R6520 Severe sepsis without septic shock: Secondary | ICD-10-CM | POA: Diagnosis present

## 2022-10-16 DIAGNOSIS — R7989 Other specified abnormal findings of blood chemistry: Secondary | ICD-10-CM | POA: Diagnosis present

## 2022-10-16 DIAGNOSIS — R8281 Pyuria: Secondary | ICD-10-CM | POA: Diagnosis present

## 2022-10-16 DIAGNOSIS — H9190 Unspecified hearing loss, unspecified ear: Secondary | ICD-10-CM | POA: Diagnosis present

## 2022-10-16 LAB — URINALYSIS, W/ REFLEX TO CULTURE (INFECTION SUSPECTED)
Bilirubin Urine: NEGATIVE
Glucose, UA: NEGATIVE mg/dL
Hgb urine dipstick: NEGATIVE
Ketones, ur: NEGATIVE mg/dL
Nitrite: NEGATIVE
Protein, ur: 100 mg/dL — AB
RBC / HPF: >50 RBC/hpf (ref 0–5)
Specific Gravity, Urine: 1.04 — ABNORMAL HIGH (ref 1.005–1.030)
WBC, UA: >50 WBC/hpf (ref 0–5)
pH: 5 (ref 5.0–8.0)

## 2022-10-16 LAB — CBC WITH DIFFERENTIAL/PLATELET
Abs Immature Granulocytes: 0.13 10*3/uL — ABNORMAL HIGH (ref 0.00–0.07)
Basophils Absolute: 0.1 10*3/uL (ref 0.0–0.1)
Basophils Relative: 0 %
Eosinophils Absolute: 0.2 10*3/uL (ref 0.0–0.5)
Eosinophils Relative: 1 %
HCT: 46.4 % — ABNORMAL HIGH (ref 36.0–46.0)
Hemoglobin: 14.3 g/dL (ref 12.0–15.0)
Immature Granulocytes: 1 %
Lymphocytes Relative: 16 %
Lymphs Abs: 2.5 10*3/uL (ref 0.7–4.0)
MCH: 29.1 pg (ref 26.0–34.0)
MCHC: 30.8 g/dL (ref 30.0–36.0)
MCV: 94.3 fL (ref 80.0–100.0)
Monocytes Absolute: 1.6 10*3/uL — ABNORMAL HIGH (ref 0.1–1.0)
Monocytes Relative: 10 %
Neutro Abs: 11.8 10*3/uL — ABNORMAL HIGH (ref 1.7–7.7)
Neutrophils Relative %: 72 %
Platelets: 385 10*3/uL (ref 150–400)
RBC: 4.92 MIL/uL (ref 3.87–5.11)
RDW: 16 % — ABNORMAL HIGH (ref 11.5–15.5)
WBC: 16.3 10*3/uL — ABNORMAL HIGH (ref 4.0–10.5)
nRBC: 0 % (ref 0.0–0.2)

## 2022-10-16 LAB — RESP PANEL BY RT-PCR (RSV, FLU A&B, COVID)  RVPGX2
Influenza A by PCR: NEGATIVE
Influenza B by PCR: NEGATIVE
Resp Syncytial Virus by PCR: NEGATIVE
SARS Coronavirus 2 by RT PCR: NEGATIVE

## 2022-10-16 LAB — COMPREHENSIVE METABOLIC PANEL
ALT: 15 U/L (ref 0–44)
AST: 40 U/L (ref 15–41)
Albumin: 2.4 g/dL — ABNORMAL LOW (ref 3.5–5.0)
Alkaline Phosphatase: 127 U/L — ABNORMAL HIGH (ref 38–126)
Anion gap: 10 (ref 5–15)
BUN: 33 mg/dL — ABNORMAL HIGH (ref 8–23)
CO2: 29 mmol/L (ref 22–32)
Calcium: 9 mg/dL (ref 8.9–10.3)
Chloride: 102 mmol/L (ref 98–111)
Creatinine, Ser: 0.8 mg/dL (ref 0.44–1.00)
GFR, Estimated: >60 mL/min (ref >60)
Glucose, Bld: 138 mg/dL — ABNORMAL HIGH (ref 70–99)
Potassium: 3.9 mmol/L (ref 3.5–5.1)
Sodium: 141 mmol/L (ref 135–145)
Total Bilirubin: 0.7 mg/dL (ref 0.3–1.2)
Total Protein: 7.9 g/dL (ref 6.5–8.1)

## 2022-10-16 LAB — MAGNESIUM: Magnesium: 1.8 mg/dL (ref 1.7–2.4)

## 2022-10-16 LAB — PROTIME-INR
INR: 1.4 — ABNORMAL HIGH (ref 0.8–1.2)
Prothrombin Time: 17.2 seconds — ABNORMAL HIGH (ref 11.4–15.2)

## 2022-10-16 LAB — TROPONIN I (HIGH SENSITIVITY)
Troponin I (High Sensitivity): 40 ng/L — ABNORMAL HIGH (ref <18)
Troponin I (High Sensitivity): 32 ng/L — ABNORMAL HIGH (ref <18)

## 2022-10-16 LAB — BRAIN NATRIURETIC PEPTIDE: B Natriuretic Peptide: 520.3 pg/mL — ABNORMAL HIGH (ref 0.0–100.0)

## 2022-10-16 LAB — LACTIC ACID, PLASMA
Lactic Acid, Venous: 1.9 mmol/L (ref 0.5–1.9)
Lactic Acid, Venous: 5.6 mmol/L (ref 0.5–1.9)
Lactic Acid, Venous: 2.8 mmol/L (ref 0.5–1.9)
Lactic Acid, Venous: 1.4 mmol/L (ref 0.5–1.9)

## 2022-10-16 LAB — TSH: TSH: 2.791 u[IU]/mL (ref 0.350–4.500)

## 2022-10-16 LAB — APTT: aPTT: 39 seconds — ABNORMAL HIGH (ref 24–36)

## 2022-10-16 LAB — PROCALCITONIN: Procalcitonin: 4.1 ng/mL

## 2022-10-16 MED ORDER — IPRATROPIUM-ALBUTEROL 0.5-2.5 (3) MG/3ML IN SOLN
3.0000 mL | Freq: Four times a day (QID) | RESPIRATORY_TRACT | Status: AC
  Administered 2022-10-16 – 2022-10-17 (×3): 3 mL via RESPIRATORY_TRACT
  Filled 2022-10-16 (×2): qty 3

## 2022-10-16 MED ORDER — SODIUM CHLORIDE 0.9 % IV SOLN
2.0000 g | INTRAVENOUS | Status: AC
  Administered 2022-10-16 – 2022-10-20 (×5): 2 g via INTRAVENOUS
  Filled 2022-10-16 (×4): qty 20
  Filled 2022-10-16: qty 2

## 2022-10-16 MED ORDER — LACTATED RINGERS IV BOLUS (SEPSIS)
500.0000 mL | Freq: Once | INTRAVENOUS | Status: AC
  Administered 2022-10-16: 500 mL via INTRAVENOUS

## 2022-10-16 MED ORDER — LACTATED RINGERS IV BOLUS (SEPSIS)
1000.0000 mL | Freq: Once | INTRAVENOUS | Status: AC
  Administered 2022-10-16: 1000 mL via INTRAVENOUS

## 2022-10-16 MED ORDER — LORAZEPAM 0.5 MG PO TABS
0.5000 mg | ORAL_TABLET | Freq: Two times a day (BID) | ORAL | Status: DC
  Administered 2022-10-16 – 2022-10-21 (×9): 0.5 mg via ORAL
  Filled 2022-10-16 (×9): qty 1

## 2022-10-16 MED ORDER — ACETAMINOPHEN 650 MG RE SUPP: 650.0000 mg | Freq: Four times a day (QID) | RECTAL | Status: DC | PRN

## 2022-10-16 MED ORDER — TRAMADOL HCL 50 MG PO TABS: 50.0000 mg | ORAL_TABLET | Freq: Two times a day (BID) | ORAL | Status: DC | PRN

## 2022-10-16 MED ORDER — ERYTHROMYCIN 5 MG/GM OP OINT
1.0000 | TOPICAL_OINTMENT | Freq: Every day | OPHTHALMIC | Status: DC
  Administered 2022-10-16 – 2022-10-20 (×4): 1 via OPHTHALMIC
  Filled 2022-10-16 (×2): qty 1

## 2022-10-16 MED ORDER — TIMOLOL MALEATE 0.5 % OP SOLN
1.0000 [drp] | Freq: Two times a day (BID) | OPHTHALMIC | Status: DC
  Administered 2022-10-16 – 2022-10-21 (×8): 1 [drp] via OPHTHALMIC
  Filled 2022-10-16 (×2): qty 5

## 2022-10-16 MED ORDER — BRIMONIDINE TARTRATE-TIMOLOL 0.2-0.5 % OP SOLN: 1.0000 [drp] | Freq: Two times a day (BID) | OPHTHALMIC | Status: DC

## 2022-10-16 MED ORDER — SENNOSIDES-DOCUSATE SODIUM 8.6-50 MG PO TABS: 1.0000 | ORAL_TABLET | Freq: Every evening | ORAL | Status: DC | PRN

## 2022-10-16 MED ORDER — LORAZEPAM 2 MG/ML IJ SOLN
0.5000 mg | Freq: Four times a day (QID) | INTRAMUSCULAR | Status: AC | PRN
  Administered 2022-10-16 – 2022-10-20 (×3): 0.5 mg via INTRAVENOUS
  Filled 2022-10-16 (×4): qty 1

## 2022-10-16 MED ORDER — ACETAMINOPHEN 325 MG PO TABS
650.0000 mg | ORAL_TABLET | Freq: Four times a day (QID) | ORAL | Status: DC | PRN
  Administered 2022-10-18 – 2022-10-21 (×2): 650 mg via ORAL
  Filled 2022-10-16 (×2): qty 2

## 2022-10-16 MED ORDER — LACTATED RINGERS IV SOLN
150.0000 mL/h | INTRAVENOUS | Status: DC
  Administered 2022-10-16: 150 mL/h via INTRAVENOUS

## 2022-10-16 MED ORDER — ONDANSETRON HCL 4 MG/2ML IJ SOLN: 4.0000 mg | Freq: Four times a day (QID) | INTRAMUSCULAR | Status: DC | PRN

## 2022-10-16 MED ORDER — LACTATED RINGERS IV SOLN: INTRAVENOUS | Status: AC

## 2022-10-16 MED ORDER — METOPROLOL TARTRATE 5 MG/5ML IV SOLN
5.0000 mg | INTRAVENOUS | Status: AC | PRN
  Administered 2022-10-16 – 2022-10-17 (×3): 5 mg via INTRAVENOUS
  Filled 2022-10-16 (×3): qty 5

## 2022-10-16 MED ORDER — CEFTRIAXONE SODIUM 2 G IJ SOLR
2.0000 g | INTRAMUSCULAR | Status: DC
Start: 2022-10-17 — End: 2022-10-16

## 2022-10-16 MED ORDER — LACTATED RINGERS IV BOLUS
1000.0000 mL | Freq: Once | INTRAVENOUS | Status: AC
  Administered 2022-10-16: 1000 mL via INTRAVENOUS

## 2022-10-16 MED ORDER — HYDRALAZINE HCL 20 MG/ML IJ SOLN: 5.0000 mg | Freq: Three times a day (TID) | INTRAMUSCULAR | Status: AC | PRN

## 2022-10-16 MED ORDER — LATANOPROST 0.005 % OP SOLN
1.0000 [drp] | Freq: Every day | OPHTHALMIC | Status: DC
  Administered 2022-10-16 – 2022-10-20 (×4): 1 [drp] via OPHTHALMIC
  Filled 2022-10-16 (×2): qty 2.5

## 2022-10-16 MED ORDER — SODIUM CHLORIDE 0.9 % IV SOLN
500.0000 mg | INTRAVENOUS | Status: DC
  Administered 2022-10-16 – 2022-10-17 (×2): 500 mg via INTRAVENOUS
  Filled 2022-10-16: qty 5
  Filled 2022-10-16: qty 500
  Filled 2022-10-16: qty 5

## 2022-10-16 MED ORDER — AZITHROMYCIN 500 MG IV SOLR
500.0000 mg | INTRAVENOUS | Status: DC
Start: 2022-10-17 — End: 2022-10-16

## 2022-10-16 MED ORDER — DIVALPROEX SODIUM 125 MG PO CSDR
125.0000 mg | DELAYED_RELEASE_CAPSULE | Freq: Two times a day (BID) | ORAL | Status: DC
  Administered 2022-10-16 – 2022-10-21 (×9): 125 mg via ORAL
  Filled 2022-10-16 (×10): qty 1

## 2022-10-16 MED ORDER — ONDANSETRON HCL 4 MG PO TABS: 4.0000 mg | ORAL_TABLET | Freq: Four times a day (QID) | ORAL | Status: DC | PRN

## 2022-10-16 MED ORDER — APIXABAN 2.5 MG PO TABS
2.5000 mg | ORAL_TABLET | Freq: Two times a day (BID) | ORAL | Status: DC
  Administered 2022-10-16 – 2022-10-21 (×9): 2.5 mg via ORAL
  Filled 2022-10-16 (×10): qty 1

## 2022-10-16 MED ORDER — BRIMONIDINE TARTRATE 0.2 % OP SOLN
1.0000 [drp] | Freq: Two times a day (BID) | OPHTHALMIC | Status: DC
  Administered 2022-10-16 – 2022-10-21 (×8): 1 [drp] via OPHTHALMIC
  Filled 2022-10-16 (×2): qty 5

## 2022-10-16 NOTE — ED Provider Notes (Addendum)
[facility] Provider Note    Event Date/Time   First MD Initiated Contact with Mindy 10/16/22 1209     (approximate)   History   Shortness of Breath   HPI  Mindy Garza is a 89 y.o. female past medical history significant for atrial fibrillation on Eliquis, chronic hypoxia on 3 L home oxygen hypertension, arthritis, presents to the emergency department with not feeling well.  Mindy Garza is a 2 days of a cough.  States that the cough has been productive and she was started on antibiotic yesterday at her nursing facility.  Today Mindy started to feel worse with generalized weakness, worsening cough and had a low blood pressure so she was sent to the emergency department with concern for sepsis.  Denies any abdominal pain, nausea, vomiting or diarrhea.  Denies any rashes or wounds.  Denies any active chest pain at this time.     Physical Exam   Triage Vital Signs: ED Triage Vitals  Enc Vitals Group     BP      Pulse      Resp      Temp      Temp src      SpO2      Weight      Height      Head Circumference      Peak Flow      Pain Score      Pain Loc      Pain Edu?      Excl. in GC?     Most recent vital signs: Vitals:   10/16/22 1215 10/16/22 1220  BP:  (!) 104/56  Pulse: (!) 140   Resp: 13 16  Temp:  99.1 F (37.3 C)  SpO2: 96% 95%    Physical Exam Constitutional:      Appearance: She is well-developed.  HENT:     Head: Atraumatic.  Eyes:     Conjunctiva/sclera: Conjunctivae normal.  Cardiovascular:     Rate and Rhythm: Regular rhythm. Tachycardia present.  Pulmonary:     Effort: Tachypnea present. No respiratory distress.     Breath sounds: Rhonchi present.     Comments: Tachypneic with coarse breath sounds that is worse on the right lung.  No wheezing.  On 3 L nasal cannula Abdominal:     General: There is no distension.     Palpations: Abdomen is soft.  Musculoskeletal:        General: Normal range of motion.      Cervical back: Normal range of motion.     Right lower leg: No tenderness. No edema.     Left lower leg: No tenderness. No edema.  Skin:    General: Skin is warm.     Capillary Refill: Capillary refill takes less than 2 seconds.  Neurological:     General: No focal deficit present.     Mental Status: She is alert. Mental status is at baseline.     IMPRESSION / MDM / ASSESSMENT AND PLAN / ED COURSE  I reviewed the triage vital signs and the nursing notes.  Mindy had reports of a low blood pressure at the facility.  When EMS arrived Mindy's blood pressure was 90 systolic.  Repeat in the emergency department was 100/56, significant tachycardia of 140.  Temperature 99.1.  Mindy received 500 cc of IV fluids prior to arrival  Differential diagnosis including atrial fibrillation with a rapid rate, sepsis, pneumonia, dehydration, electrolyte abnormality, anemia, GI bleed  Given Mindy's hypotension and tachycardia blood cultures and lactic acid obtained.  Given her productive cough concern for respiratory source of started empirically on antibiotics to cover for community-acquired pneumonia.  Felt that 30 cc/kg of IV fluids may be detrimental to the Mindy given her shortness of breath and history of heart failure, will give 1 L of IV fluids and reevaluate.  EKG  I, Corena Herter, the attending physician, personally viewed and interpreted this ECG.  Atrial flutter with rapid rate.  Heart rate 141.  QRS 128.  QTc 521.  Nonspecific ST changes.  Increased rate when compared to prior.  Tachycardia with atrial flutter on cardiac telemetry  RADIOLOGY I independently reviewed imaging, my interpretation of imaging: Chest x-ray concerning for focal opacities to the right lung.  LABS (all labs ordered are listed, but only abnormal results are displayed) Labs interpreted as -    Labs Reviewed  COMPREHENSIVE METABOLIC PANEL - Abnormal; Notable for the following components:      Result  Value   Glucose, Bld 138 (*)    BUN 33 (*)    Albumin 2.4 (*)    Alkaline Phosphatase 127 (*)    All other components within normal limits  CBC WITH DIFFERENTIAL/PLATELET - Abnormal; Notable for the following components:   WBC 16.3 (*)    HCT 46.4 (*)    RDW 16.0 (*)    Neutro Abs 11.8 (*)    Monocytes Absolute 1.6 (*)    Abs Immature Granulocytes 0.13 (*)    All other components within normal limits  PROTIME-INR - Abnormal; Notable for the following components:   Prothrombin Time 17.2 (*)    INR 1.4 (*)    All other components within normal limits  APTT - Abnormal; Notable for the following components:   aPTT 39 (*)    All other components within normal limits  URINALYSIS, W/ REFLEX TO CULTURE (INFECTION SUSPECTED) - Abnormal; Notable for the following components:   Color, Urine AMBER (*)    APPearance TURBID (*)    Specific Gravity, Urine 1.040 (*)    Protein, ur 100 (*)    Leukocytes,Ua LARGE (*)    Bacteria, UA RARE (*)    All other components within normal limits  BRAIN NATRIURETIC PEPTIDE - Abnormal; Notable for the following components:   B Natriuretic Peptide 520.3 (*)    All other components within normal limits  TROPONIN I (HIGH SENSITIVITY) - Abnormal; Notable for the following components:   Troponin I (High Sensitivity) 40 (*)    All other components within normal limits  RESP PANEL BY RT-PCR (RSV, FLU A&B, COVID)  RVPGX2  CULTURE, BLOOD (ROUTINE X 2)  CULTURE, BLOOD (ROUTINE X 2)  LACTIC ACID, PLASMA  TSH  MAGNESIUM  LACTIC ACID, PLASMA  TROPONIN I (HIGH SENSITIVITY)     MDM  Mindy with significant leukocytosis.  Initial lactic acid within normal limits.  COVID and influenza testing are negative.  Creatinine is at her baseline with no significant electrolyte abnormalities.  Chest x-ray concerning for focal opacities to the right upper lung and left upper lung base.  Enlarged heart with small amount of effusions.  Does have an elevated BNP.   Clinical  Course as of 10/16/22 1432  Sat Oct 16, 2022  1431 On reevaluation Mindy with a softer blood pressure, continues to be tachycardic in 130s.  Given a second liter of IV fluids. [SM]    Clinical Course User Index [SM] Corena Herter, MD   [facility] hospitalist  for admission for community-acquired pneumonia and tachycardia.  PROCEDURES:  Critical Care performed: yes  .Critical Care  Performed by: Corena Herter, MD Authorized by: Corena Herter, MD   Critical care provider statement:    Critical care time (minutes):  30   Critical care time was exclusive of:  Separately billable procedures and treating other patients   Critical care was necessary to treat or prevent imminent or life-threatening deterioration of the following conditions:  Sepsis   Critical care was time spent personally by me on the following activities:  Development of treatment plan with Mindy or surrogate, discussions with consultants, evaluation of Mindy's response to treatment, examination of Mindy, ordering and review of laboratory studies, ordering and review of radiographic studies, ordering and performing treatments and interventions, pulse oximetry, re-evaluation of Mindy's condition and review of old charts   Mindy's presentation is most consistent with acute presentation with potential threat to life or bodily function.   MEDICATIONS ORDERED IN ED: Medications  lactated ringers infusion ( Intravenous New Bag/Given 10/16/22 1233)  cefTRIAXone (ROCEPHIN) 2 g in sodium chloride 0.9 % 100 mL IVPB (0 g Intravenous Stopped 10/16/22 1253)  azithromycin (ZITHROMAX) 500 mg in sodium chloride 0.9 % 250 mL IVPB (0 mg Intravenous Stopped 10/16/22 1400)  lactated ringers bolus 1,000 mL (0 mLs Intravenous Stopped 10/16/22 1333)  lactated ringers bolus 1,000 mL (1,000 mLs Intravenous New Bag/Given 10/16/22 1426)    FINAL CLINICAL IMPRESSION(S) / ED DIAGNOSES   Final diagnoses:  SOB (shortness of breath)   Community acquired pneumonia, unspecified laterality     Rx / DC Orders   ED Discharge Orders     None        Note:  This document was prepared using Dragon voice recognition software and may include unintentional dictation errors.   Corena Herter, MD 10/16/22 1337    Corena Herter, MD 10/16/22 1432

## 2022-10-16 NOTE — Hospital Course (Signed)
[name] is a 89 year old female with history of chronic hypoxic respiratory failure on 3 L nasal cannula at baseline, atrial fibrillation on Eliquis, anxiety, history of osteoarthritis, hypertension, who presents emergency department for chief concerns of shortness of breath.  Patient is from [facility] nursing facility.  Vitals showed temperature of 99.1, respiration rate of 16, heart rate 140, blood pressure 104/56, SpO2 of 95% on 2 L nasal cannula.  Serum sodium is 141, potassium 3.9, chloride 102, bicarb 29, BUN of 33, serum creatinine of 0.80, EGFR greater than 60, nonfasting blood glucose 138, WBC elevated at 16.3, hemoglobin 14.3, platelets of 385.  BNP was elevated at 520.3.  High since troponin was 40.  Lactic acid was 1.9.  COVID/influenza A/influenza B/RSV PCR were negative.  ED treatment: LR 1 L bolus x 2, LR 150 mL/h,  azithromycin 500 mg IV daily and ceftriaxone 2 g IV daily for 5-day course were ordered,  4/21: Vital stable, afebrile.  Lactic acidosis resolved.  Mildly positive troponin with a downward trend, 40>>35.  Preliminary blood cultures negative. Patient seems extremely lethargic, with intermittent hypotension, remained in A-fib with RVR.  Not really following any commands, did say no to pain, appears to have some dyspnea as well as breathing with mouth open.  Had a long discussion with daughter who is also her POA, Apparently continues to decline over the past few month.  Family would like to proceed with comfort care knowing that if we give her some morphine for air hunger that can further worsen her hypotension.  They do not want to involve cardiology or any aggressive measures to prolong life, no pressors, but they would like to continue antibiotics for another day to see her response, if continue to get worse they might withdraw all the treatment then. Palliative care was also consulted.  4/22: Patient remained hemodynamically stable.  Clinically appears much better  than yesterday, on 3 L of oxygen.  Discussed with daughter and we will continue antibiotics, she was interested her mother being sent back to her facility with hospice help.  4/23: Heart rate remained elevated despite increasing the dose of metoprolol to 25 mg twice daily.  Blood pressure borderline which will preclude further increase in beta-blocker. Cardiology was consulted for heart rate control as she might need amiodarone.  Patient is an established patient with [facility] cardiology.

## 2022-10-16 NOTE — Sepsis Progress Note (Signed)
Elink following code sepsis °

## 2022-10-16 NOTE — H&P (Addendum)
History and Physical   Mindy Garza PPI:[id number] DOB: Oct 12, 1925 DOA: 10/16/2022  PCP: Marguarite Arbour, MD Outpatient Specialists: Dr. Olevia Perches clinic cardiology Patient coming from: Home via EMS  I have personally briefly reviewed patient's old medical records in [facility] EMR.  Chief Concern: Shortness of breath  HPI: [name] is a 89 year old female with history of chronic hypoxic respiratory failure on 3 L nasal cannula at baseline, atrial fibrillation on Eliquis, anxiety, history of osteoarthritis, hypertension, who presents emergency department for chief concerns of shortness of breath.  Patient is from [facility] nursing facility.  Vitals showed temperature of 99.1, respiration rate of 16, heart rate 140, blood pressure 104/56, SpO2 of 95% on 2 L nasal cannula.  Serum sodium is 141, potassium 3.9, chloride 102, bicarb 29, BUN of 33, serum creatinine of 0.80, EGFR greater than 60, nonfasting blood glucose 138, WBC elevated at 16.3, hemoglobin 14.3, platelets of 385.  BNP was elevated at 520.3.  High since troponin was 40.  Lactic acid was 1.9.  COVID/influenza A/influenza B/RSV PCR were negative.  ED treatment: LR 1 L bolus x 2, LR 150 mL/h,  azithromycin 500 mg IV daily and ceftriaxone 2 g IV daily for 5-day course were ordered, --------------------------- At bedside, she was able to tell me her name, she knows she is in the hospital.  She states her age is 89 years old and the year is 87 after multiple attempts.  She appears acutely ill, and short of breath with SpO2 saturation of 86% 3 L in place.  Patient's heart rate was in the 136, with blood pressure 98/80 at bedside.  Her oxygen supplementation fell off to 1 side of her nares, patient peers to be unaware.  Addendum: I asked nursing to push metoprolol 5 mg IV as needed dosing that has been ordered. Heart rate improved to 94, 5 minutes after metoprolol IV has been administered.  I replaced the oxygen.  Patient not able to reliably provide information.  She is coughing at bedside and appears mild to moderately short of breath at this time.  Heart rate improved to 90-99 and blood pressure improved to 113/94 at bedside.  Oxygen saturations improved from 89 on 3 L nasal cannula to 94%.  Social history: She is from [facility] nursing facility. Per daughter, patient does use tobacco, EtOH, recreational drug use products  ROS: Unable to complete as patient has dementia and acutely ill  ED Course: Discussed with emergency medicine provider, patient requiring hospitalization for chief concerns shortness of breath, pneumonia.  Assessment/Plan  Principal Problem:   Sepsis due to pneumonia Active Problems:   Multifocal pneumonia   Chronic atrial fibrillation (HCC)   Tachycardia   Frequent falls   Anxiety   DNR (do not resuscitate)   Elevated troponin   Dyspnea   Pyuria   Assessment and Plan:  * Sepsis due to pneumonia Right upper lobe and left lower lobe pneumonia Continue to follow third lactic acid a second lactic acid was uptrending Patient had increased respiration rate, increased heart rate, elevated lactic acid, elevated leukocytosis, source of infection is urine versus multilobar pneumonia, organ involvement are cardiac Continue ceftriaxone and azithromycin IV Check procalcitonin urgency Blood cultures x 2 are in process Admit to PCU, inpatient  Multifocal pneumonia Status post LR 1 L bolus x 2, LR 150 mL/h infusion, azithromycin 500 mg IV and ceftriaxone 2 g IV daily, 5 days ordered Will order additional LR 500 mL bolus on admission Continue to follow  third lactic acid as second lactic acid is elevated Procalcitonin is markedly elevated on admission Will check repeat procalcitonin in the a.m. to ensure antibiotic effective  Pyuria Large leukocytes on UA, and in setting of altered mentation with possible sepsis, patient likely has UTI on admission Continue with ceftriaxone 2 g IV  daily  Dyspnea Secondary to multilobar pneumonia DuoNebs 4 times daily, 3 doses ordered If patient's symptoms do not improve with abx and duonebs in 1 day, AM team to consider CT chest imaging vs CTA chest for further evaluation  Elevated troponin Presumed secondary to demand ischemia, low clinical suspicion for ACS at this time  DNR (do not resuscitate) Per ACP documentation, healthcare power of attorney and declaration of desire for natural death.  Anxiety Ativan 0.5 mg IV every 6 hours as needed for anxiety, 3 doses ordered  Frequent falls Fall precautions  Tachycardia Presumed secondary to sepsis in setting of multilobar pneumonia Metoprolol 5 mg IV every 2 hours as needed for heart rate greater than 120, 3 doses ordered  Chronic atrial fibrillation (HCC) Eliquis 2.5 mg p.o. twice daily resumed  Chart reviewed.   DVT prophylaxis: Eliquis Code Status: Allow natural death, DNR/DNI per ACP documentation and confirmed with daughter/HCPOA Diet: Heart healthy Family Communication: called and updated healthcare power of attorney, Cephus Slater, at [phone] Disposition Plan:  Consults called: None at this time Admission status: PCU, inpatient  Past Medical History:  Diagnosis Date   Atrial fibrillation    followed by Mindy Kansky, MD   Cancer 2001    right breast cancer diagnosed December 02, 2005 status post modified radical mastectomy. The patient had had an invasive carcinoma recurrence in five years after treatment for DCIS with wide excision and postoperative radiation therapy. Her tumor was estrogen positive. 0/15 nodes were negative. An axillary dissection was completed as a sentinel node could not be identified.    Malignant neoplasm of upper-outer quadrant of female breast    Right, T1a, N0, M0 ER-positive, PR negative, HER-2/neu not overexpressing.     Osteoporosis 2012   Personal history of colonic polyps 2010   tubulovillous polyp of the appendiceal orifice      Personal history of malignant neoplasm of breast 2001,2007   Past Surgical History:  Procedure Laterality Date   APPENDECTOMY     BREAST LUMPECTOMY Right 2001   COLON RESECTION  1994   COLONOSCOPY  2010, 2014   Dr. Lemar Livings   HAND SURGERY     MASTECTOMY Right 2007   SKIN CANCER EXCISION  2009   TUBAL LIGATION     Social History:  reports that she has never smoked. She has never used smokeless tobacco. She reports that she does not drink alcohol and does not use drugs.  No Known Allergies Family History  Problem Relation Age of Onset   Breast cancer Other    Family history: Family history reviewed and not pertinent  Prior to Admission medications   Medication Sig Start Date End Date Taking? Authorizing Provider  apixaban (ELIQUIS) 2.5 MG TABS tablet Take 2.5 mg by mouth 2 (two) times daily.  06/18/19   [provider]  brimonidine (ALPHAGAN) 0.2 % ophthalmic solution Place 1 drop into both eyes 2 (two) times daily. 01/26/20   [provider]  brimonidine-timolol (COMBIGAN) 0.2-0.5 % ophthalmic solution Place 1 drop into both eyes once.    [provider]  Calcium Carbonate-Vitamin D (CALCIUM 500/VITAMIN D PO) Take 1,500 mg by mouth daily. Patient not taking: Reported  on 05/13/2020    [provider]  fish oil-omega-3 fatty acids 1000 MG capsule Take 2 g by mouth daily. Patient not taking: Reported on 05/13/2020    [provider]  LORazepam (ATIVAN) 1 MG tablet Take 1 mg by mouth every 8 (eight) hours.    [provider]  Multiple Vitamin (MULTIVITAMIN) tablet Take 1 tablet by mouth daily.    [provider]  potassium chloride SA (KLOR-CON) 20 MEQ tablet Take 10 mEq by mouth daily. 1/2 tab once daily 02/24/20   [provider]  timolol (TIMOPTIC) 0.5 % ophthalmic solution Place 1 drop into both eyes 2 (two) times daily. 05/08/20   [provider]  vitamin E 1000 UNIT capsule Take 1,000 Units by mouth  daily.    [provider]  warfarin (COUMADIN) 1 MG tablet Take 1 mg by mouth daily.  05/12/20  [provider]   Physical Exam: Vitals:   10/16/22 1553 10/16/22 1600 10/16/22 1608 10/16/22 1618  BP:  114/82    Pulse:  (!) 130 (!) 102 (!) 101  Resp:  (!) 38    Temp:      TempSrc:      SpO2:  92%    Weight: 52.2 kg     Height: 5\' 3"  (1.6 m)      Constitutional: appears acutely ill, frail, mildly distressed and increased respiratory effort Eyes: PERRL, lids and conjunctivae normal ENMT: Mucous membranes are dry. Posterior pharynx clear of any exudate or lesions. Age-appropriate dentition. Hearing appropriate Neck: normal, supple, no masses, no thyromegaly Respiratory: Generalized decreased lung sounds bilaterally on auscultation, no wheezing, no crackles.  Increased respiratory effort.  Increased accessory muscle use.  3 L nasal cannula in place Cardiovascular: Regular rate and rhythm, no murmurs / rubs / gallops. No extremity edema. 2+ pedal pulses. No carotid bruits.  Abdomen: no tenderness, no masses palpated, no hepatosplenomegaly. Bowel sounds positive.  Musculoskeletal: no clubbing / cyanosis. No joint deformity upper and lower extremities. Good ROM, no contractures, no atrophy. Normal muscle tone.  Skin: no rashes, lesions, ulcers. No induration Neurologic: Sensation intact. Strength 5/5 in all 4.  Psychiatric: Lacks judgment and insight as patient is acutely ill. Alert and oriented x self and location only. Normal mood.   EKG: independently reviewed, showing sinus tachycardia with a rate of 141, QTc 521  Chest x-ray on Admission: I personally reviewed and I agree with radiologist reading as below.  DG Chest Port 1 View  Result Date: 10/16/2022 CLINICAL DATA:  Sepsis EXAM: PORTABLE CHEST 1 VIEW COMPARISON:  Chest x-ray 07/01/2014 and older. Report of the study from 01/02/2019 FINDINGS: Enlarged cardiopericardial silhouette. Small effusions. No pneumothorax.  Focal asymmetric opacity along the inferior aspect of the right upper lobe. Acute infiltrates possible. Is also some mild opacity in the left lung base. No pneumothorax. Overlapping cardiac leads. Surgical clips in the right axillary region IMPRESSION: Focal opacity seen right upper lobe and left lung base. Possible acute infiltrate. Enlarged heart with tiny effusions. Electronically Signed   By: Karen Kays M.D.   On: 10/16/2022 13:18    Labs on Admission: I have personally reviewed following labs  CBC: Recent Labs  Lab 10/16/22 1222  WBC 16.3*  NEUTROABS 11.8*  HGB 14.3  HCT 46.4*  MCV 94.3  PLT 385   Basic Metabolic Panel: Recent Labs  Lab 10/16/22 1222  NA 141  K 3.9  CL 102  CO2 29  GLUCOSE 138*  BUN 33*  CREATININE  0.80  CALCIUM 9.0  MG 1.8   GFR: Estimated Creatinine Clearance: 35.4 mL/min (by C-G formula based on SCr of 0.8 mg/dL).  Liver Function Tests: Recent Labs  Lab 10/16/22 1222  AST 40  ALT 15  ALKPHOS 127*  BILITOT 0.7  PROT 7.9  ALBUMIN 2.4*   Coagulation Profile: Recent Labs  Lab 10/16/22 1222  INR 1.4*   Thyroid Function Tests: Recent Labs    10/16/22 1222  TSH 2.791   Urine analysis:    Component Value Date/Time   COLORURINE AMBER (A) 10/16/2022 1222   APPEARANCEUR TURBID (A) 10/16/2022 1222   APPEARANCEUR Hazy 06/25/2014 1944   LABSPEC 1.040 (H) 10/16/2022 1222   LABSPEC 1.008 06/25/2014 1944   PHURINE 5.0 10/16/2022 1222   GLUCOSEU NEGATIVE 10/16/2022 1222   GLUCOSEU Negative 06/25/2014 1944   HGBUR NEGATIVE 10/16/2022 1222   BILIRUBINUR NEGATIVE 10/16/2022 1222   BILIRUBINUR Negative 06/25/2014 1944   KETONESUR NEGATIVE 10/16/2022 1222   PROTEINUR 100 (A) 10/16/2022 1222   NITRITE NEGATIVE 10/16/2022 1222   LEUKOCYTESUR LARGE (A) 10/16/2022 1222   LEUKOCYTESUR 1+ 06/25/2014 1944   CRITICAL CARE Performed by: Dr. Sedalia Muta  Total critical care time: 35 minutes  Critical care time was exclusive of separately billable  procedures and treating other patients.  Critical care was necessary to treat or prevent imminent or life-threatening deterioration.  Critical care was time spent personally by me on the following activities: development of treatment plan with patient and/or surrogate as well as nursing, discussions with consultants, evaluation of patient's response to treatment, examination of patient, obtaining history from patient or surrogate, ordering and performing treatments and interventions, ordering and review of laboratory studies, ordering and review of radiographic studies, pulse oximetry and re-evaluation of patient's condition.  This document was prepared using Dragon Voice Recognition software and may include unintentional dictation errors.  Dr. Sedalia Muta [facility]  If 7PM-7AM, please contact overnight-coverage provider If 7AM-7PM, please contact day attending provider [email or web address]  10/16/2022, 4:38 PM

## 2022-10-16 NOTE — Assessment & Plan Note (Signed)
Presumed secondary to demand ischemia, low clinical suspicion for ACS at this time

## 2022-10-16 NOTE — Assessment & Plan Note (Signed)
Fall precautions.  

## 2022-10-16 NOTE — Assessment & Plan Note (Addendum)
Status post LR 1 L bolus x 2, LR 150 mL/h infusion, azithromycin 500 mg IV and ceftriaxone 2 g IV daily, 5 days ordered Will order additional LR 500 mL bolus on admission Continue to follow third lactic acid as second lactic acid is elevated Procalcitonin is markedly elevated on admission Will check repeat procalcitonin in the a.m. to ensure antibiotic effective

## 2022-10-16 NOTE — Assessment & Plan Note (Signed)
Per ACP documentation, healthcare power of attorney and declaration of desire for natural death.

## 2022-10-16 NOTE — ED Triage Notes (Signed)
[facility] due to [facility]. Pt denies shob and states she was placed on antibiotics yesterday. Pt wears 3L [place] at baseline. Pt has hx of afib 140HR on arrival. Pt given PTA. Pt is oriented to self and situation. Pt disoriented to time, place.

## 2022-10-16 NOTE — Consult Note (Signed)
CODE SEPSIS - PHARMACY COMMUNICATION  **Broad Spectrum Antibiotics should be administered within 1 hour of Sepsis diagnosis**  Time Code Sepsis Called/Page Received: 1223  Antibiotics Ordered: ceftriaxone and azithromycin  Time of 1st antibiotic administration: 1230  Additional action taken by pharmacy: N/A  Barrie Folk ,PharmD Clinical Pharmacist  10/16/2022  12:20 PM

## 2022-10-16 NOTE — Assessment & Plan Note (Addendum)
Secondary to multilobar pneumonia DuoNebs 4 times daily, 3 doses ordered

## 2022-10-16 NOTE — Assessment & Plan Note (Addendum)
Right upper lobe and left lower lobe pneumonia. Patient met severe sepsis criteria, lactic acidosis elevated which can make her in septic shock.  Lactic acidosis improved.  Preliminary blood cultures negative.  Procalcitonin improving. Patient remained hypotensive requiring more IV fluid. Family would like to keep focus on comfort with some antibiotics to see her response.  No pressors. -Continue ceftriaxone and Zithromax for another day. -Palliative care consult

## 2022-10-16 NOTE — Assessment & Plan Note (Signed)
-   Ativan 0.5 mg IV every 6 hours as needed for anxiety, 3 doses ordered 

## 2022-10-16 NOTE — Assessment & Plan Note (Signed)
Presumed secondary to sepsis in setting of multilobar pneumonia Metoprolol 5 mg IV every 2 hours as needed for heart rate greater than 120, 3 doses ordered

## 2022-10-16 NOTE — ED Notes (Signed)
BrendManuela SchwartzNP at bedside evaluating patient. Jon Billings, NP aware of current HR and BP. Pt in no acute distress, denies difficulty breathing or pain.

## 2022-10-16 NOTE — Assessment & Plan Note (Signed)
Patient is currently in A-fib with RVR. Heart rate rate responded transiently with metoprolol but that causes her blood pressure to decrease further.  Received multiple boluses.  BNP is also elevated. -Can use metoprolol if blood pressure allows. -Family does not want to involve cardiology or do any other aggressive measures at this time. -Will continue Eliquis for now if she is able to take p.o.

## 2022-10-16 NOTE — Assessment & Plan Note (Signed)
Large leukocytes on UA, and in setting of altered mentation with possible sepsis, patient likely has UTI on admission Continue with ceftriaxone 2 g IV daily 

## 2022-10-17 DIAGNOSIS — R8281 Pyuria: Secondary | ICD-10-CM | POA: Diagnosis not present

## 2022-10-17 DIAGNOSIS — R000 Tachycardia, unspecified: Secondary | ICD-10-CM

## 2022-10-17 DIAGNOSIS — A419 Sepsis, unspecified organism: Secondary | ICD-10-CM

## 2022-10-17 DIAGNOSIS — J189 Pneumonia, unspecified organism: Secondary | ICD-10-CM | POA: Diagnosis not present

## 2022-10-17 DIAGNOSIS — R0600 Dyspnea, unspecified: Secondary | ICD-10-CM

## 2022-10-17 DIAGNOSIS — I482 Chronic atrial fibrillation, unspecified: Secondary | ICD-10-CM

## 2022-10-17 LAB — CBC
HCT: 39.3 % (ref 36.0–46.0)
Hemoglobin: 12.3 g/dL (ref 12.0–15.0)
MCH: 29.5 pg (ref 26.0–34.0)
MCHC: 31.3 g/dL (ref 30.0–36.0)
MCV: 94.2 fL (ref 80.0–100.0)
Platelets: 333 10*3/uL (ref 150–400)
RBC: 4.17 MIL/uL (ref 3.87–5.11)
RDW: 16.1 % — ABNORMAL HIGH (ref 11.5–15.5)
WBC: 14.4 10*3/uL — ABNORMAL HIGH (ref 4.0–10.5)
nRBC: 0 % (ref 0.0–0.2)

## 2022-10-17 LAB — CULTURE, BLOOD (ROUTINE X 2)

## 2022-10-17 LAB — BASIC METABOLIC PANEL
Anion gap: 9 (ref 5–15)
BUN: 21 mg/dL (ref 8–23)
CO2: 27 mmol/L (ref 22–32)
Calcium: 8.3 mg/dL — ABNORMAL LOW (ref 8.9–10.3)
Chloride: 102 mmol/L (ref 98–111)
Creatinine, Ser: 0.54 mg/dL (ref 0.44–1.00)
GFR, Estimated: >60 mL/min (ref >60)
Glucose, Bld: 88 mg/dL (ref 70–99)
Potassium: 3.8 mmol/L (ref 3.5–5.1)
Sodium: 138 mmol/L (ref 135–145)

## 2022-10-17 LAB — PROCALCITONIN: Procalcitonin: 3.06 ng/mL

## 2022-10-17 LAB — PROTIME-INR
INR: 1.3 — ABNORMAL HIGH (ref 0.8–1.2)
Prothrombin Time: 15.7 seconds — ABNORMAL HIGH (ref 11.4–15.2)

## 2022-10-17 LAB — CORTISOL-AM, BLOOD: Cortisol - AM: 20.8 ug/dL (ref 6.7–22.6)

## 2022-10-17 MED ORDER — METOPROLOL TARTRATE 5 MG/5ML IV SOLN: 5.0000 mg | Freq: Once | INTRAVENOUS | Status: DC

## 2022-10-17 MED ORDER — METOPROLOL TARTRATE 5 MG/5ML IV SOLN
5.0000 mg | INTRAVENOUS | Status: AC | PRN
  Administered 2022-10-18: 5 mg via INTRAVENOUS
  Filled 2022-10-17: qty 5

## 2022-10-17 MED ORDER — METOPROLOL TARTRATE 5 MG/5ML IV SOLN
5.0000 mg | INTRAVENOUS | Status: AC | PRN
  Administered 2022-10-17 (×3): 5 mg via INTRAVENOUS
  Filled 2022-10-17 (×3): qty 5

## 2022-10-17 MED ORDER — MORPHINE SULFATE (PF) 2 MG/ML IV SOLN
1.0000 mg | INTRAVENOUS | Status: DC | PRN
  Administered 2022-10-18 – 2022-10-20 (×5): 1 mg via INTRAVENOUS
  Filled 2022-10-17 (×5): qty 1

## 2022-10-17 MED ORDER — LACTATED RINGERS IV BOLUS
500.0000 mL | Freq: Once | INTRAVENOUS | Status: AC
  Administered 2022-10-17: 500 mL via INTRAVENOUS

## 2022-10-17 NOTE — ED Notes (Signed)
Advised nurse that patient has ready bed 

## 2022-10-17 NOTE — Progress Notes (Signed)
Progress Note   Patient: Mindy Garza MVH:[id number] DOB: 1926/01/18 DOA: 10/16/2022     1 DOS: the patient was seen and examined on 10/17/2022   Brief hospital course: [name] is a 89 year old female with history of chronic hypoxic respiratory failure on 3 L nasal cannula at baseline, atrial fibrillation on Eliquis, anxiety, history of osteoarthritis, hypertension, who presents emergency department for chief concerns of shortness of breath.  Patient is from [facility] nursing facility.  Vitals showed temperature of 99.1, respiration rate of 16, heart rate 140, blood pressure 104/56, SpO2 of 95% on 2 L nasal cannula.  Serum sodium is 141, potassium 3.9, chloride 102, bicarb 29, BUN of 33, serum creatinine of 0.80, EGFR greater than 60, nonfasting blood glucose 138, WBC elevated at 16.3, hemoglobin 14.3, platelets of 385.  BNP was elevated at 520.3.  High since troponin was 40.  Lactic acid was 1.9.  COVID/influenza A/influenza B/RSV PCR were negative.  ED treatment: LR 1 L bolus x 2, LR 150 mL/h,  azithromycin 500 mg IV daily and ceftriaxone 2 g IV daily for 5-day course were ordered,  4/21: Vital stable, afebrile.  Lactic acidosis resolved.  Mildly positive troponin with a downward trend, 40>>35.  Preliminary blood cultures negative. Patient seems extremely lethargic, with intermittent hypotension, remained in A-fib with RVR.  Not really following any commands, did say no to pain, appears to have some dyspnea as well as breathing with mouth open.  Had a long discussion with daughter who is also her POA, Apparently continues to decline over the past few month.  Family would like to proceed with comfort care knowing that if we give her some morphine for air hunger that can further worsen her hypotension.  They do not want to involve cardiology or any aggressive measures to prolong life, no pressors, but they would like to continue antibiotics for another day to see her response, if continue to  get worse they might withdraw all the treatment then. Palliative care was also consulted.  Assessment and Plan: * Sepsis due to pneumonia Right upper lobe and left lower lobe pneumonia. Patient met severe sepsis criteria, lactic acidosis elevated which can make her in septic shock.  Lactic acidosis improved.  Preliminary blood cultures negative.  Procalcitonin improving. Patient remained hypotensive requiring more IV fluid. Family would like to keep focus on comfort with some antibiotics to see her response.  No pressors. -Continue ceftriaxone and Zithromax for another day. -Palliative care consult  Multifocal pneumonia -See above  Chronic atrial fibrillation [facility]) Patient is currently in A-fib with RVR. Heart rate rate responded transiently with metoprolol but that causes her blood pressure to decrease further.  Received multiple boluses.  BNP is also elevated. -Can use metoprolol if blood pressure allows. -Family does not want to involve cardiology or do any other aggressive measures at this time. -Will continue Eliquis for now if she is able to take p.o.  Tachycardia Presumed secondary to sepsis in setting of multilobar pneumonia Metoprolol 5 mg IV every 2 hours as needed for heart rate greater than 120, 3 doses ordered  Pyuria Large leukocytes on UA, and in setting of altered mentation with possible sepsis, patient likely has UTI on admission Continue with ceftriaxone 2 g IV daily  Elevated troponin Presumed secondary to demand ischemia, low clinical suspicion for ACS at this time  Dyspnea Secondary to multilobar pneumonia DuoNebs 4 times daily,  Will use low-dose morphine to help with air hunger  DNR (do not resuscitate)  Per ACP documentation, healthcare power of attorney and declaration of desire for natural death.  Anxiety Ativan 0.5 mg IV every 6 hours as needed for anxiety, 3 doses ordered  Frequent falls Fall precautions   Subjective: Patient appears very  lethargic, dyspneic and breathing with mild open.  Said no to pain 1 time but not following other commands.  Physical Exam: Vitals:   10/17/22 0844 10/17/22 0845 10/17/22 1130 10/17/22 1210  BP:  (!) 119/97 116/80 120/64  Pulse:    (!) 126  Resp:  (!) 26 (!) 21 20  Temp: 97.6 F (36.4 C)   97.8 F (36.6 C)  TempSrc: Axillary   Axillary  SpO2:   97% 97%  Weight:      Height:       General.  Frail and malnourished elderly lady, in no acute distress. Pulmonary.  Harsh breath sounds with mildly increased work of breathing. CV.  Irregularly irregular with tachycardia Abdomen.  Soft, nontender, nondistended, BS positive. CNS.  Lethargic.  Not following most of the commands Extremities.  No edema, no cyanosis, pulses intact and symmetrical. Psychiatry.  Judgment and insight appears impaired  Data Reviewed: Prior data reviewed  Family Communication: Discussed with daughter on phone.  Disposition: Status is: Inpatient Remains inpatient appropriate because: Severity of illness  Planned Discharge Destination: Hospice versus back to facility  DVT prophylaxis.  Eliquis Time spent: 50 minutes  This record has been created using Conservation officer, historic buildings. Errors have been sought and corrected,but may not always be located. Such creation errors do not reflect on the standard of care.   Author: Arnetha Courser, MD 10/17/2022 1:33 PM  For on call review [email or web address].

## 2022-10-17 NOTE — ED Notes (Signed)
Dr. Nelson Chimes notified pt's BP is now 84/55

## 2022-10-17 NOTE — Consult Note (Signed)
Consultation Note Date: 10/17/2022   Patient Name: Mindy Garza  DOB: 20-Sep-1925  MRN: [id number]  Age / Sex: 89 y.o., female  PCP: Marguarite Arbour, MD Referring Physician: Arnetha Courser, MD  Reason for Consultation: Establishing goals of care   HPI/Brief Hospital Course: 89 y.o. female  with past medical history of chronic respiratory failure on 3L [place] at baseline, Atrial Fibrillation on Eliquis, OA and HTN admitted from [facility] on 10/16/2022 with reports of increased shortness of breath.   She had recently been started on treatment at nursing facility for suspected PNA  CXR: IMPRESSION: Focal opacity seen right upper lobe and left lung base. Possible acute infiltrate.   Enlarged heart with tiny effusions.  Admitted for sepsis secondary to multifocal PNA  Palliative medicine was consulted for assisting with transition to comfort care as requested by Dr. Nelson Chimes after conversations had with family.  Subjective:  Extensive chart review has been completed prior to meeting patient including labs, vital signs, imaging, progress notes, orders, and available advanced directive documents from current and previous encounters.  Introduced myself as a Publishing rights manager as a member of the palliative care team. Explained palliative medicine is specialized medical care for people living with serious illness. It focuses on providing relief from the symptoms and stress of a serious illness. The goal is to improve quality of life for both the patient and the family.   Visited with Mindy Garza at her bedside. Awake and alert, able to answer simple orientation questions. Unsure of the reason for being brought to the hospital or the need for her continued stay. Shares she has been a long term care resident at [facility] for about 2 years. Reviewed CXR findings and current treatment plan including IV antibiotics with a focus on comfort. She voices understanding  and agreement. Denies pain or discomfort. No signs of distress on exam. Nursing staff rounding at end of visit, education provided on signs and symptoms to monitor for regarding respiratory distress and need to administer morphine/lorazepam. At this time, no signs of acute distress or discomfort, no need for medication administration at this time.  Called and spoke with daughter-Mindy Garza. Introduced myself as Palliative, NP. 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 Share their understanding of Mindy Garza current medical condition and disease process. Mindy Garza confirms desires for comfort measures with continuing IV antibiotics temporarily in hopes of recovery. Main goal is to avoid suffering or discomfort. Mindy Garza is currently on vacation at the [employer], she expresses her appreciation for the call and update. Requests for PMT to call and update daily on [name]' progression. She will be available to return home if Mindy Garza continues to decline and her passing is imminent.  Time for outcomes with use of IV antibiotics with focus on comfort without escalating care and forgoing aggressive interventions.  Agree with IV Morphine and Lorazepam to maintain comfort as needed for air hunger, anxiety, pain, increased WOB, increased RR or respiratory distress.  All questions/concerns addressed. Emotional support provided to patient/family/support persons. PMT will continue to follow and support patient as needed.  Objective: Primary Diagnoses: Present on Admission:  Multifocal pneumonia  DNR (do not resuscitate)  Anxiety  Chronic atrial fibrillation (HCC)  Elevated troponin  Sepsis due to pneumonia  Tachycardia  Dyspnea  Pyuria   Physical Exam Constitutional:      General: She is not in acute distress.  Appearance: She is ill-appearing.  Pulmonary:     Effort: Pulmonary effort is normal. No respiratory distress.  Skin:     General: Skin is warm and dry.  Neurological:     Mental Status: She is alert.     Motor: Weakness present.     Vital Signs: BP 120/64 (BP Location: Left Arm)   Pulse (!) 126   Temp 97.8 F (36.6 C) (Axillary)   Resp 20   Ht  (1.6 m)   Wt 52.2 kg   SpO2 97%   BMI 20.37 kg/m  Pain Scale: 0-10    Palliative Assessment/Data: 40%   Assessment and Plan  SUMMARY OF RECOMMENDATIONS   DNR Comfort measures with ongoing IV antibiotics for short trial-time for outcomes, avoid aggressive medical interventions Continue use of IV Morphine/Lorazepam to maintain comfort PMT to continue to follow for ongoing needs and support  Discussed With: Primary team and nursing staff   Thank you for this consult and allowing Palliative Medicine to participate in the care of Mindy Garza. Mindy Garza. Palliative medicine will continue to follow and assist as needed.   Time Total: 75 minutes  Time spent includes: Detailed review of medical records (labs, imaging, vital signs), medically appropriate exam (mental status, respiratory, cardiac, skin), discussed with treatment team, counseling and educating patient, family and staff, documenting clinical information, medication management and coordination of care.   Signed by: Leeanne Deed, DNP, AGNP-C Palliative Medicine    Please contact Palliative Medicine Team phone at [phone] for questions and concerns.  For individual provider: See Loretha Stapler

## 2022-10-17 NOTE — ED Notes (Signed)
Complete bed change done.

## 2022-10-17 NOTE — ED Notes (Signed)
Dr. Nelson Chimes contacted to ask if anything was going to be attempted to help pt's HR. Dr. Nelson Chimes notified pt's HR is currently 130's, and pt's BP has been low all morning.

## 2022-10-18 DIAGNOSIS — R000 Tachycardia, unspecified: Secondary | ICD-10-CM | POA: Diagnosis not present

## 2022-10-18 DIAGNOSIS — R296 Repeated falls: Secondary | ICD-10-CM

## 2022-10-18 DIAGNOSIS — R0602 Shortness of breath: Secondary | ICD-10-CM | POA: Diagnosis not present

## 2022-10-18 DIAGNOSIS — Z66 Do not resuscitate: Secondary | ICD-10-CM

## 2022-10-18 DIAGNOSIS — J189 Pneumonia, unspecified organism: Secondary | ICD-10-CM | POA: Diagnosis not present

## 2022-10-18 DIAGNOSIS — I482 Chronic atrial fibrillation, unspecified: Secondary | ICD-10-CM | POA: Diagnosis not present

## 2022-10-18 DIAGNOSIS — Z515 Encounter for palliative care: Secondary | ICD-10-CM | POA: Diagnosis not present

## 2022-10-18 LAB — CULTURE, BLOOD (ROUTINE X 2)
Culture: NO GROWTH
Special Requests: ADEQUATE

## 2022-10-18 MED ORDER — METOPROLOL TARTRATE 25 MG PO TABS
12.5000 mg | ORAL_TABLET | Freq: Two times a day (BID) | ORAL | Status: DC
  Administered 2022-10-18 (×2): 12.5 mg via ORAL
  Filled 2022-10-18 (×2): qty 1

## 2022-10-18 MED ORDER — AZITHROMYCIN 500 MG PO TABS
500.0000 mg | ORAL_TABLET | Freq: Every day | ORAL | Status: AC
  Administered 2022-10-18 – 2022-10-20 (×3): 500 mg via ORAL
  Filled 2022-10-18: qty 2
  Filled 2022-10-18: qty 1
  Filled 2022-10-18: qty 2

## 2022-10-18 NOTE — Progress Notes (Signed)
PHARMACIST - PHYSICIAN COMMUNICATION  CONCERNING: Antibiotic IV to Oral Route Change Policy  RECOMMENDATION: This patient is receiving azithromycin by the intravenous route.  Based on criteria approved by the Pharmacy and Therapeutics Committee, the antibiotic(s) is/are being converted to the equivalent oral dose form(s).  DESCRIPTION: These criteria include: Patient being treated for a respiratory tract infection, urinary tract infection, cellulitis or clostridium difficile associated diarrhea if on metronidazole The patient is not neutropenic and does not exhibit a GI malabsorption state The patient is eating (either orally or via tube) and/or has been taking other orally administered medications for a least 24 hours The patient is improving clinically and has a Tmax < 100.5  If you have questions about this conversion, please contact the Pharmacy Department   Tressie Ellis 10/18/22

## 2022-10-18 NOTE — Progress Notes (Signed)
Progress Note   Patient: Mindy Garza:[id number] DOB: Jun 25, 1926 DOA: 10/16/2022     2 DOS: the patient was seen and examined on 10/18/2022   Brief hospital course: [name] is a 89 year old female with history of chronic hypoxic respiratory failure on 3 L nasal cannula at baseline, atrial fibrillation on Eliquis, anxiety, history of osteoarthritis, hypertension, who presents emergency department for chief concerns of shortness of breath.  Patient is from [facility] nursing facility.  Vitals showed temperature of 99.1, respiration rate of 16, heart rate 140, blood pressure 104/56, SpO2 of 95% on 2 L nasal cannula.  Serum sodium is 141, potassium 3.9, chloride 102, bicarb 29, BUN of 33, serum creatinine of 0.80, EGFR greater than 60, nonfasting blood glucose 138, WBC elevated at 16.3, hemoglobin 14.3, platelets of 385.  BNP was elevated at 520.3.  High since troponin was 40.  Lactic acid was 1.9.  COVID/influenza A/influenza B/RSV PCR were negative.  ED treatment: LR 1 L bolus x 2, LR 150 mL/h,  azithromycin 500 mg IV daily and ceftriaxone 2 g IV daily for 5-day course were ordered,  4/21: Vital stable, afebrile.  Lactic acidosis resolved.  Mildly positive troponin with a downward trend, 40>>35.  Preliminary blood cultures negative. Patient seems extremely lethargic, with intermittent hypotension, remained in A-fib with RVR.  Not really following any commands, did say no to pain, appears to have some dyspnea as well as breathing with mouth open.  Had a long discussion with daughter who is also her POA, Apparently continues to decline over the past few month.  Family would like to proceed with comfort care knowing that if we give her some morphine for air hunger that can further worsen her hypotension.  They do not want to involve cardiology or any aggressive measures to prolong life, no pressors, but they would like to continue antibiotics for another day to see her response, if continue to  get worse they might withdraw all the treatment then. Palliative care was also consulted.  4/22: Patient remained hemodynamically stable.  Clinically appears much better than yesterday, on 3 L of oxygen.  Discussed with daughter and we will continue antibiotics, she was interested her mother being sent back to her facility with hospice help.  Assessment and Plan: * Sepsis due to pneumonia Right upper lobe and left lower lobe pneumonia. Patient met severe sepsis criteria, lactic acidosis elevated which can make her in septic shock.  Lactic acidosis improved.  Preliminary blood cultures negative.  Procalcitonin improving.  Blood pressure improved Family would like to keep focus on comfort with some antibiotics to see her response.  No pressors. -Continue ceftriaxone and Zithromax for now, will discharge on Levaquin to complete a total of 5-day course. -Palliative care consult-daughter is interested going back to her facility with hospice help  Multifocal pneumonia -See above  Chronic atrial fibrillation (HCC) Heart rate much improved but do show some RVR intermittently. -Will add metoprolol 12.5 mg twice daily as blood pressure is much better today -Can use metoprolol if blood pressure allows. -Family does not want to involve cardiology or do any other aggressive measures at this time. -Will continue Eliquis for now if she is able to take p.o.  Tachycardia Improving. Presumed secondary to sepsis in setting of multilobar pneumonia Metoprolol 5 mg IV every 2 hours as needed for heart rate greater than 120, 3 doses ordered  Pyuria Large leukocytes on UA, and in setting of altered mentation with possible sepsis, patient likely has  UTI on admission Continue with ceftriaxone 2 g IV daily  Elevated troponin Presumed secondary to demand ischemia, low clinical suspicion for ACS at this time  Dyspnea Secondary to multilobar pneumonia DuoNebs 4 times daily,  Will use low-dose morphine to  help with air hunger  DNR (do not resuscitate) Per ACP documentation, healthcare power of attorney and declaration of desire for natural death.  Anxiety Ativan 0.5 mg IV every 6 hours as needed for anxiety, 3 doses ordered  Frequent falls Fall precautions   Subjective: Patient was seen and examined today.  She was sitting upright and looking much better than yesterday.  Denies any pain.  Physical Exam: Vitals:   10/18/22 0300 10/18/22 0400 10/18/22 0500 10/18/22 0927  BP:  128/85  120/86  Pulse:  (!) 105  (!) 127  Resp: (!) 21 (!) 24 (!) 24 18  Temp:  97.7 F (36.5 C)  98.1 F (36.7 C)  TempSrc:  Axillary  Oral  SpO2:  93%  95%  Weight:      Height:       General.  Frail elderly lady, in no acute distress. Pulmonary.  Scattered rhonchi bilaterally, normal respiratory effort. CV.  Regular rate and rhythm, no JVD, rub or murmur. Abdomen.  Soft, nontender, nondistended, BS positive. CNS.  Alert and oriented to self only.  No focal neurologic deficit. Extremities.  No edema, no cyanosis, pulses intact and symmetrical. Psychiatry.  Judgment and insight appears impaired Data Reviewed: Prior data reviewed  Family Communication: Discussed with daughter on phone.  Disposition: Status is: Inpatient Remains inpatient appropriate because: Severity of illness  Planned Discharge Destination: Hospice versus back to facility  DVT prophylaxis.  Eliquis Time spent: 45 minutes  This record has been created using Conservation officer, historic buildings. Errors have been sought and corrected,but may not always be located. Such creation errors do not reflect on the standard of care.   Author: Arnetha Courser, MD 10/18/2022 1:57 PM  For on call review [email or web address].

## 2022-10-18 NOTE — Assessment & Plan Note (Signed)
Improving. Presumed secondary to sepsis in setting of multilobar pneumonia Metoprolol 5 mg IV every 2 hours as needed for heart rate greater than 120, 3 doses ordered

## 2022-10-18 NOTE — Progress Notes (Signed)
Transition of Care [facility]) - Progression Note    Patient Details  Name: Mindy Garza MRN: [id number] Date of Birth: [birth year]  [facility] CM/SW Contact  Truddie Hidden, RN Phone Number: 10/18/2022, 1:56 PM  Clinical Narrative:   Per [facility] at [facility], patient is LTC at facility.  Spoke with her daughter per her request she is interested in hospice and does not have a choice. She is agreeable to the agency the facility utilizes.   Referral made to Renea Ee from [facility].         Expected Discharge Plan and Services                                               Social Determinants of Health (SDOH) Interventions SDOH Screenings   Food Insecurity: No Food Insecurity (10/17/2022)  Housing: Low Risk  (10/17/2022)  Transportation Needs: No Transportation Needs (10/17/2022)  Tobacco Use: Low Risk  (10/16/2022)    Readmission Risk Interventions     No data to display

## 2022-10-18 NOTE — Progress Notes (Signed)
Nutrition Brief Note  Chart reviewed. Per Palliative care notes, "comfort measures with continuing IV antibiotics temporarily in hopes of recovery. Main goal is to avoid suffering or discomfort".  RD will liberalize diet to regular for widest variety of meal selections.  No further nutrition interventions planned at this time.  Please re-consult as needed.   Levada Schilling, RD, LDN, CDCES Registered Dietitian II Certified Diabetes Care and Education Specialist Please refer to [facility] for RD and/or RD on-call/weekend/after hours pager

## 2022-10-18 NOTE — Assessment & Plan Note (Signed)
Right upper lobe and left lower lobe pneumonia. Patient met severe sepsis criteria, lactic acidosis elevated which can make her in septic shock.  Lactic acidosis improved.  Preliminary blood cultures negative.  Procalcitonin improving.  Blood pressure improved Family would like to keep focus on comfort with some antibiotics to see her response.  No pressors. -Continue ceftriaxone and Zithromax for now, will discharge on Levaquin to complete a total of 5-day course. -Palliative care consult-daughter is interested going back to her facility with hospice help

## 2022-10-18 NOTE — Progress Notes (Signed)
                                                     Palliative Care Progress Note, Assessment & Plan   Patient Name: Mindy Garza       Date: 10/18/2022 DOB: 09-13-1925  Age: 89 y.o. MRN#: [id number] Attending Physician: Arnetha Courser, MD Primary Care Physician: Marguarite Arbour, MD Admit Date: 10/16/2022  Subjective: Patient is lying in bed.  She acknowledges my presence and is able to make her wishes known.  She has no acute complaints at this time.  She asks why she is still here as she is ready to go home.  No family or friends present at bedside.  HPI: 89 y.o. female  with past medical history of chronic respiratory failure on 3L [place] at baseline, Atrial Fibrillation on Eliquis, OA and HTN admitted from [facility] on 10/16/2022 with reports of increased shortness of breath.    She had recently been started on treatment at nursing facility for suspected PNA   CXR: IMPRESSION: Focal opacity seen right upper lobe and left lung base. Possible acute infiltrate.   Enlarged heart with tiny effusions.   Admitted for sepsis secondary to multifocal PNA   Palliative medicine was consulted for assisting with transition to comfort care as requested by Dr. Nelson Chimes after conversations had with family.  Summary of counseling/coordination of care: After reviewing the patient's chart and assessing the patient at bedside, patient's pain and symptoms assessed. Patient endorses mild low back pain that was relieves after I repositioned her in the bed. No other acute complaints at this time.   After visiting with the patient, I counseled with attending Dr. Nelson Chimes and hospice liaison Renea Ee.    DNR remains. Goals are clear. Plan is set for patient to discharge back to LTC with hospice services.    PMT will remain available to patient and family and  monitor her peripherally.  Please reengage with PMT at patient/family's request, if goals change, or if patient's health deteriorates during this hospitalization.  Physical Exam Vitals reviewed.  Constitutional:      General: She is not in acute distress.    Appearance: She is not ill-appearing.  HENT:     Head: Normocephalic.  Cardiovascular:     Rate and Rhythm: Normal rate.  Abdominal:     Palpations: Abdomen is soft.  Musculoskeletal:     Comments: Generalized weakness  Neurological:     Mental Status: She is alert.  Psychiatric:        Mood and Affect: Mood normal. Mood is not anxious.        Behavior: Behavior is not agitated.             Total Time 25 minutes   Kunaal Walkins L. Manon Hilding, FNP-BC Palliative Medicine Team Team Phone # [phone]

## 2022-10-18 NOTE — Assessment & Plan Note (Signed)
Secondary to multilobar pneumonia DuoNebs 4 times daily,  Will use low-dose morphine to help with air hunger

## 2022-10-18 NOTE — Plan of Care (Signed)

## 2022-10-18 NOTE — Assessment & Plan Note (Signed)
Remained in RVR with heart rate in 130s most of the time despite increasing the dose of metoprolol.  I did not be able to increase further due to blood pressure. -Cardiology consult for heart rate control-might need amiodarone -Will continue Eliquis for now

## 2022-10-18 NOTE — Progress Notes (Signed)
Insurance claims handler Liaison Note  Received request from Princess Perna, SW [facility], for hospice services at home after discharge. Spoke with Cephus Slater, daughter/POA  to initiate education related to hospice philosophy, services, and team approach to care. Patient/family verbalized understanding of information given. Per discussion, the plan is for discharge back to [facility] via EMS when ready.  DME needs discussed. Patient has the following equipment in the home: oxygen, walker and wheelchair Patient/family requests the following equipment for delivery:  agreeable to let admission RN evaluate DME needs              The address has been verified and is correct in the chart.  Please send signed and completed DNR home with patient/family if applicable.  Please provide prescriptions at discharge as needed to ensure ongoing symptom management.  [facility] information and contact numbers given to [employer].  Continued collaboration with patient/family and [facility] staff is ongoing through final disposition.  Please call with any hospice related questions or concerns. Thank you for the opportunity to participate in this patient's care.  Norris Cross, RN Nurse Liaison [phone]

## 2022-10-19 DIAGNOSIS — J189 Pneumonia, unspecified organism: Secondary | ICD-10-CM | POA: Diagnosis not present

## 2022-10-19 DIAGNOSIS — R000 Tachycardia, unspecified: Secondary | ICD-10-CM | POA: Diagnosis not present

## 2022-10-19 DIAGNOSIS — I482 Chronic atrial fibrillation, unspecified: Secondary | ICD-10-CM | POA: Diagnosis not present

## 2022-10-19 DIAGNOSIS — Z66 Do not resuscitate: Secondary | ICD-10-CM | POA: Diagnosis not present

## 2022-10-19 DIAGNOSIS — Z515 Encounter for palliative care: Secondary | ICD-10-CM | POA: Diagnosis not present

## 2022-10-19 DIAGNOSIS — R0602 Shortness of breath: Secondary | ICD-10-CM | POA: Diagnosis not present

## 2022-10-19 LAB — MAGNESIUM: Magnesium: 2 mg/dL (ref 1.7–2.4)

## 2022-10-19 MED ORDER — METOPROLOL TARTRATE 25 MG PO TABS
25.0000 mg | ORAL_TABLET | Freq: Two times a day (BID) | ORAL | Status: DC
  Administered 2022-10-19: 25 mg via ORAL
  Filled 2022-10-19: qty 1

## 2022-10-19 MED ORDER — METOPROLOL TARTRATE 25 MG PO TABS
25.0000 mg | ORAL_TABLET | Freq: Three times a day (TID) | ORAL | Status: DC
  Administered 2022-10-19 – 2022-10-21 (×4): 25 mg via ORAL
  Filled 2022-10-19 (×6): qty 1

## 2022-10-19 NOTE — Progress Notes (Addendum)
  Civil engineer, contracting [facility]) Hospital Liaison Note   Patient referred to Hospice to follow at [facility] upon discharge.     Hospital Hospice Liaison will continue to follow.       Thank you for the opportunity to participate in this patient's care.  Redge Gainer, [facility] Liaison [phone]

## 2022-10-19 NOTE — Consult Note (Cosign Needed Addendum)
[facility] CLINIC CARDIOLOGY CONSULT NOTE       Patient ID: Mindy Garza MRN: [id number] DOB/AGE: 03-07-1926 89 y.o.  Admit date: 10/16/2022 Referring Physician Dr. Arnetha Courser  Primary Physician Dr. Aram Beecham Primary Cardiologist Dr. Juliann Pares Reason for Consultation AF RVR   HPI: Mindy Garza is a 89yoF with a PMH of permanent atrial fibrillation (eliquis), chronic respiratory failure (3L baseline), osteoarthritis, HTN, breast cancer, dementia who presented to [facility] ED 10/16/2022 from [facility] with shortness of breath on her baseline oxygen.  Cardiology is consulted on hospital day 3 for assistance with her AF RVR.  History is obtained entirely from the patient's chart as there is no family present at bedside and the patient has baseline dementia.  She presented to [facility] ED 10/16/2022 with shortness of breath and is being treated for community-acquired pneumonia with azithromycin and ceftriaxone.  Palliative care has seen the patient and discussed with her family members regarding goals of care, she is planned to continue current course of IV antibiotics but the patient's daughter desires to focus on comfort.  Plan is to go back to [facility] facility with hospice services at discharge with as needed medications (morphine and lorazepam) for air hunger, and other symptoms.  At my time of evaluation the patient is resting comfortably sleeping but awakens eventually to voice.  She says she "has not woke up yet for the day" and is rather hard of hearing and does not answer my questions.  She appears comfortable on oxygen by nasal cannula and does not appear to be in distress.  Heart rate taken at the bedside by myself is elevated at 129 bpm and is irregular, likely in atrial fibrillation.  She is not currently on telemetry.  Labs from 2 days ago are notable for a potassium of 3.8, BUN/creatinine 21/0.54 and GFR >60.  On admission her BNP was slightly elevated at 520.  And  high-sensitivity troponin was borderline elevated and flat trending at 40, 32.  WBCs were elevated but downtrending from 16.3-14.4 when last checked on 4/21.  Review of systems limited by patient's baseline dementia   Past Medical History:  Diagnosis Date   Atrial fibrillation    followed by Mariel Kansky, MD   Cancer 2001    right breast cancer diagnosed December 02, 2005 status post modified radical mastectomy. The patient had had an invasive carcinoma recurrence in five years after treatment for DCIS with wide excision and postoperative radiation therapy. Her tumor was estrogen positive. 0/15 nodes were negative. An axillary dissection was completed as a sentinel node could not be identified.    Malignant neoplasm of upper-outer quadrant of female breast    Right, T1a, N0, M0 ER-positive, PR negative, HER-2/neu not overexpressing.     Osteoporosis 2012   Personal history of colonic polyps 2010   tubulovillous polyp of the appendiceal orifice     Personal history of malignant neoplasm of breast 2001,2007    Past Surgical History:  Procedure Laterality Date   APPENDECTOMY     BREAST LUMPECTOMY Right 2001   COLON RESECTION  1994   COLONOSCOPY  2010, 2014   Dr. Lemar Livings   HAND SURGERY     MASTECTOMY Right 2007   SKIN CANCER EXCISION  2009   TUBAL LIGATION      Medications Prior to Admission  Medication Sig Dispense Refill Last Dose   acetaminophen (TYLENOL) 650 MG CR tablet Take 1,300 mg by mouth every 8 (eight) hours as needed for  pain.   10/16/2022 at 1004   apixaban (ELIQUIS) 2.5 MG TABS tablet Take 2.5 mg by mouth 2 (two) times daily.    10/16/2022 at 1004   brimonidine-timolol (COMBIGAN) 0.2-0.5 % ophthalmic solution Place 1 drop into both eyes every 12 (twelve) hours.   10/16/2022 at 1005   Calcium Carbonate-Vitamin D (CALCIUM 500/VITAMIN D PO) Take 1,500 mg by mouth daily.   10/16/2022 at 1004   desvenlafaxine (PRISTIQ) 50 MG 24 hr tablet Take 50 mg by mouth daily.   10/16/2022 at 1004    divalproex (DEPAKOTE SPRINKLE) 125 MG capsule Take 125 mg by mouth 2 (two) times daily.   10/16/2022 at 1004   doxycycline (VIBRA-TABS) 100 MG tablet Take 100 mg by mouth 2 (two) times daily.   10/16/2022 at 1004   erythromycin ophthalmic ointment Place 1 Application into both eyes at bedtime.   10/15/2022 at 2149   fish oil-omega-3 fatty acids 1000 MG capsule Take 2 g by mouth daily.   10/16/2022 at 1004   furosemide (LASIX) 20 MG tablet Take 10 mg by mouth daily.   10/16/2022 at 1004   latanoprost (XALATAN) 0.005 % ophthalmic solution Place 1 drop into both eyes at bedtime.   10/15/2022 at 2149   LORazepam (ATIVAN) 0.5 MG tablet Take 0.5 mg by mouth 2 (two) times daily.   10/16/2022 at 1004   melatonin 5 MG TABS Take 10 mg by mouth at bedtime.   10/15/2022 at 2149   Multiple Vitamin (MULTIVITAMIN) tablet Take 1 tablet by mouth daily.   10/16/2022 at 1004   polyethylene glycol (MIRALAX / GLYCOLAX) 17 g packet Take 17 g by mouth daily.   10/16/2022 at 1004   potassium chloride (KLOR-CON M) 10 MEQ tablet Take 10 mEq by mouth daily.   10/16/2022 at 1004   traMADol (ULTRAM) 50 MG tablet Take 50 mg by mouth 2 (two) times daily as needed for moderate pain.   10/16/2022 at 1004   brimonidine (ALPHAGAN) 0.2 % ophthalmic solution Place 1 drop into both eyes 2 (two) times daily. (Patient not taking: Reported on 10/16/2022)   Not Taking   potassium chloride SA (KLOR-CON) 20 MEQ tablet Take 10 mEq by mouth daily. 1/2 tab once daily (Patient not taking: Reported on 10/16/2022)   Not Taking   timolol (TIMOPTIC) 0.5 % ophthalmic solution Place 1 drop into both eyes 2 (two) times daily. (Patient not taking: Reported on 10/16/2022)   Not Taking   vitamin E 1000 UNIT capsule Take 1,000 Units by mouth daily. (Patient not taking: Reported on 10/16/2022)   Not Taking   Social History   Socioeconomic History   Marital status: Widowed    Spouse name: Not on file   Number of children: Not on file   Years of education: Not on file    Highest education level: Not on file  Occupational History   Not on file  Tobacco Use   Smoking status: Never   Smokeless tobacco: Never  Substance and Sexual Activity   Alcohol use: No   Drug use: No   Sexual activity: Not Currently  Other Topics Concern   Not on file  Social History Narrative   Not on file   Social Determinants of Health   Financial Resource Strain: Not on file  Food Insecurity: No Food Insecurity (10/17/2022)   Hunger Vital Sign    Worried About Running Out of Food in the Last Year: Never true    Ran Out of Food in the  Last Year: Never true  Transportation Needs: No Transportation Needs (10/17/2022)   PRAPARE - Administrator, Civil Service (Medical): No    Lack of Transportation (Non-Medical): No  Physical Activity: Not on file  Stress: Not on file  Social Connections: Not on file  Intimate Partner Violence: Unknown (10/17/2022)   Humiliation, Afraid, Rape, and Kick questionnaire    Fear of Current or Ex-Partner: Not on file    Emotionally Abused: No    Physically Abused: No    Sexually Abused: No    Family History  Problem Relation Age of Onset   Breast cancer Other       Intake/Output Summary (Last 24 hours) at 10/19/2022 1422 Last data filed at 10/19/2022 1014 Gross per 24 hour  Intake 100 ml  Output --  Net 100 ml    Vitals:   10/19/22 0258 10/19/22 0500 10/19/22 0730 10/19/22 1114  BP: (!) 100/59 102/64 117/85 101/66  Pulse: (!) 124 (!) 115 (!) 133 (!) 134  Resp:  Temp:  98 F (36.7 C) 98.6 F (37 C) 98 F (36.7 C)  TempSrc:  Oral Oral Axillary  SpO2: (!) 82% 96% 94% 97%  Weight:      Height:        PHYSICAL EXAM General: elderly and ill appearing caucasian female, in no acute distress. Laying at incline in bed, sleeping but awakens to voice HEENT:  Normocephalic and atraumatic. Poor dentition. Hard of hearing Neck:  No JVD.  Lungs: Normal respiratory effort on O2 by [place]. Clear bilaterally to  auscultation. No wheezes, crackles, rhonchi.  Heart: tachy irregular irregular . Normal S1 and S2 without gallops or murmurs.  Abdomen: Non-distended appearing.  Msk: Normal strength and tone for age. Extremities: Warm and well perfused. No clubbing, cyanosis. No peripheral edema.  Neuro: somnolent, awakens to voice. Pleasantly confused Psych: unable to assess.   Labs: Basic Metabolic Panel: Recent Labs    10/17/22 0842  NA 138  K 3.8  CL 102  CO2 27  GLUCOSE 88  BUN 21  CREATININE 0.54  CALCIUM 8.3*   Liver Function Tests: No results for input(s): "AST", "ALT", "ALKPHOS", "BILITOT", "PROT", "ALBUMIN" in the last 72 hours. No results for input(s): "LIPASE", "AMYLASE" in the last 72 hours. CBC: Recent Labs    10/17/22 0842  WBC 14.4*  HGB 12.3  HCT 39.3  MCV 94.2  PLT 333   Cardiac Enzymes: No results for input(s): "CKTOTAL", "CKMB", "CKMBINDEX", "TROPONINIHS" in the last 72 hours. BNP: No results for input(s): "BNP" in the last 72 hours. D-Dimer: No results for input(s): "DDIMER" in the last 72 hours. Hemoglobin A1C: No results for input(s): "HGBA1C" in the last 72 hours. Fasting Lipid Panel: No results for input(s): "CHOL", "HDL", "LDLCALC", "TRIG", "CHOLHDL", "LDLDIRECT" in the last 72 hours. Thyroid Function Tests: No results for input(s): "TSH", "T4TOTAL", "T3FREE", "THYROIDAB" in the last 72 hours.  Invalid input(s): "FREET3" Anemia Panel: No results for input(s): "VITAMINB12", "FOLATE", "FERRITIN", "TIBC", "IRON", "RETICCTPCT" in the last 72 hours.   Radiology: [facility] Chest Port 1 View  Result Date: 10/16/2022 CLINICAL DATA:  Sepsis EXAM: PORTABLE CHEST 1 VIEW COMPARISON:  Chest x-ray 07/01/2014 and older. Report of the study from 01/02/2019 FINDINGS: Enlarged cardiopericardial silhouette. Small effusions. No pneumothorax. Focal asymmetric opacity along the inferior aspect of the right upper lobe. Acute infiltrates possible. Is also some mild opacity in the  left lung base. No pneumothorax. Overlapping cardiac leads. Surgical clips in  the right axillary region IMPRESSION: Focal opacity seen right upper lobe and left lung base. Possible acute infiltrate. Enlarged heart with tiny effusions. Electronically Signed   By: Karen Kays M.D.   On: 10/16/2022 13:18    ECHO 12/01/2018 DOPPLER ECHO and OTHER SPECIAL PROCEDURES                 Aortic: MILD AR                    No AS                         195.5 cm/sec peak vel      15.3 mmHg peak grad                 Mitral: MILD MR                    No MS                         MV Inflow E Vel = 88.3 cm/sec     MV Annulus E'Vel = 6.1 cm/sec                         E/E'Ratio = 14.5              Tricuspid: SEVERE TR                  No TS                         312.2 cm/sec peak TR vel   42.0 mmHg peak RV pressure              Pulmonary: TRIVIAL PR                 No PS  _________________________________________________________________________________________  INTERPRETATION  NORMAL LEFT VENTRICULAR SYSTOLIC FUNCTION  NORMAL RIGHT VENTRICULAR SYSTOLIC FUNCTION  SEVERE VALVULAR REGURGITATION (See above)  NO VALVULAR STENOSIS  Severe TR with dilated RA and dilated IVC  _________________________________________________________________________________________  Electronically signed by      Danella Penton, MD on 12/04/2018 06: 14 PM           Performed By: Johnathan Hausen     Ordering Physician: Percell Locus reviewed by me (LT) 10/19/2022 : none available  EKG reviewed by me: atrial flutter RVR rate 141  Data reviewed by me (LT) 10/19/2022: ed note, palliative care note, hospitalist progress ntoe and admission H&P, last 24h vitals tele labs imaging I/O    Principal Problem:   Sepsis due to pneumonia Active Problems:   Chronic atrial fibrillation (HCC)   Tachycardia   Frequent falls   Anxiety   Multifocal pneumonia   DNR (do not resuscitate)   Elevated troponin   Dyspnea   Pyuria     ASSESSMENT AND PLAN:  Mindy Garza is a 89yoF with a PMH of permanent atrial fibrillation (eliquis), chronic respiratory failure (3L baseline), osteoarthritis, HTN, breast cancer, dementia who presented to [facility] ED 10/16/2022 from [facility] facility with shortness of breath on her baseline oxygen.  Cardiology is consulted on hospital day 3 for assistance with her AF RVR.  # Multifocal pneumonia # Chronic respiratory failure # Dementia # Permanent atrial fibrillation with RVR Presents from [facility] nursing facility with shortness of breath, chest  x-ray and labs consistent with multifocal pneumonia for which she is being treated for with IV antibiotics.  She has been seen by palliative care and the patient's daughter has clear goals to primarily focus on the patient's comfort and to not escalate care.  She is a DNR and plan is to go home with hospice services following hospital discharge.  She has a history of permanent atrial fibrillation, suspect that her rapid heart rates during this admission (120s-130s by pulse ox) are elevated in the setting of pneumonia. -Agree with current therapy, appreciate palliative care assistance -Continue to treat causes of increased adrenergic tone including pain, hypoxia, electrolyte disturbances, infection, etc. -Start telemetry monitoring if tolerated by patient/okay with family -Increase metoprolol tartrate frequency to 25 mg 3 times daily -Continue Eliquis 2.5 mg twice daily for stroke prevention. -Consider antiarrhythmics (amiodarone) if heart rate remains elevated and blood pressure does not tolerate doses of BB, although not preferred as the patient is on azithromycin, scheduled Ativan, and PRN Zofran (QT prolonging medications) currently. -Defer additional cardiac diagnostics  This patient's plan of care was discussed and created with Dr. Juliann Pares and he is in agreement.  Signed: Rebeca Allegra , PA-C 10/19/2022, 2:22 PM [facility]  Cardiology

## 2022-10-19 NOTE — Progress Notes (Signed)
                                                     Palliative Care Progress Note, Assessment & Plan   Patient Name: Mindy Garza       Date: 10/19/2022 DOB: 05/27/1926  Age: 89 y.o. MRN#: [id number] Attending Physician: Arnetha Courser, MD Primary Care Physician: Marguarite Arbour, MD Admit Date: 10/16/2022  Subjective: Patient is lying in bed in no apparent distress.  Respirations are even and unlabored.  She awakens to my presence but quickly falls back to sleep.  No family or friends present at bedside.  HPI: 89 y.o. female  with past medical history of chronic respiratory failure on 3L [place] at baseline, Atrial Fibrillation on Eliquis, OA and HTN admitted from [facility] on 10/16/2022 with reports of increased shortness of breath.    She had recently been started on treatment at nursing facility for suspected PNA   CXR: IMPRESSION: Focal opacity seen right upper lobe and left lung base. Possible acute infiltrate.   Enlarged heart with tiny effusions.   Admitted for sepsis secondary to multifocal PNA   Palliative medicine was consulted for assisting with transition to comfort care as requested by Dr. Nelson Chimes after conversations had with family.  Summary of counseling/coordination of care: After reviewing the patient's chart and assessing the patient at bedside, I counseled with attending Dr. Nelson Chimes and hospice liaison Misty in regards to plan and goals of care.  Plan remains for patient to return to LTC with hospice services. TOC following closely for discharge planning.   DNR remains.   No acute palliative needs today.  PMT will continue to monitor the patient peripherally.  Please reengage with PMT if goals change, at patient/family's request, or if patient's health deteriorates during this hospitalization.  Physical Exam Vitals  reviewed.  Constitutional:      General: She is not in acute distress.    Appearance: She is normal weight. She is not ill-appearing.  HENT:     Head: Normocephalic.  Cardiovascular:     Rate and Rhythm: Tachycardia present. Rhythm irregular.  Pulmonary:     Effort: Pulmonary effort is normal.  Skin:    General: Skin is warm.  Neurological:     Mental Status: She is alert.  Psychiatric:        Mood and Affect: Mood is not anxious.        Behavior: Behavior is not agitated.             Total Time 25 minutes   Puanani Gene L. Manon Hilding, FNP-BC Palliative Medicine Team Team Phone # [phone]

## 2022-10-19 NOTE — Assessment & Plan Note (Signed)
Large leukocytes on UA, and in setting of altered mentation with possible sepsis, patient likely has UTI on admission Continue with ceftriaxone 2 g IV daily

## 2022-10-19 NOTE — Progress Notes (Signed)
Progress Note   Patient: Mindy Garza ZOX:[id number] DOB: 1926/06/27 DOA: 10/16/2022     3 DOS: the patient was seen and examined on 10/19/2022   Brief hospital course: [name] is a 89 year old female with history of chronic hypoxic respiratory failure on 3 L nasal cannula at baseline, atrial fibrillation on Eliquis, anxiety, history of osteoarthritis, hypertension, who presents emergency department for chief concerns of shortness of breath.  Patient is from [facility] nursing facility.  Vitals showed temperature of 99.1, respiration rate of 16, heart rate 140, blood pressure 104/56, SpO2 of 95% on 2 L nasal cannula.  Serum sodium is 141, potassium 3.9, chloride 102, bicarb 29, BUN of 33, serum creatinine of 0.80, EGFR greater than 60, nonfasting blood glucose 138, WBC elevated at 16.3, hemoglobin 14.3, platelets of 385.  BNP was elevated at 520.3.  High since troponin was 40.  Lactic acid was 1.9.  COVID/influenza A/influenza B/RSV PCR were negative.  ED treatment: LR 1 L bolus x 2, LR 150 mL/h,  azithromycin 500 mg IV daily and ceftriaxone 2 g IV daily for 5-day course were ordered,  4/21: Vital stable, afebrile.  Lactic acidosis resolved.  Mildly positive troponin with a downward trend, 40>>35.  Preliminary blood cultures negative. Patient seems extremely lethargic, with intermittent hypotension, remained in A-fib with RVR.  Not really following any commands, did say no to pain, appears to have some dyspnea as well as breathing with mouth open.  Had a long discussion with daughter who is also her POA, Apparently continues to decline over the past few month.  Family would like to proceed with comfort care knowing that if we give her some morphine for air hunger that can further worsen her hypotension.  They do not want to involve cardiology or any aggressive measures to prolong life, no pressors, but they would like to continue antibiotics for another day to see her response, if continue to  get worse they might withdraw all the treatment then. Palliative care was also consulted.  4/22: Patient remained hemodynamically stable.  Clinically appears much better than yesterday, on 3 L of oxygen.  Discussed with daughter and we will continue antibiotics, she was interested her mother being sent back to her facility with hospice help.  4/23: Heart rate remained elevated despite increasing the dose of metoprolol to 25 mg twice daily.  Blood pressure borderline which will preclude further increase in beta-blocker. Cardiology was consulted for heart rate control as she might need amiodarone.  Patient is an established patient with [facility] cardiology.  Assessment and Plan: * Sepsis due to pneumonia Right upper lobe and left lower lobe pneumonia. Patient met severe sepsis criteria, lactic acidosis elevated which can make her in septic shock.  Lactic acidosis improved.  Preliminary blood cultures negative.  Procalcitonin improving.  Blood pressure improved Family would like to keep focus on comfort with some antibiotics to see her response.  No pressors. -Continue ceftriaxone and Zithromax for now, will discharge on Levaquin to complete a total of 5-day course. -Palliative care consult-daughter is interested going back to her facility with hospice help  Multifocal pneumonia -See above  Chronic atrial fibrillation (HCC) Remained in RVR with heart rate in 130s most of the time despite increasing the dose of metoprolol.  I did not be able to increase further due to blood pressure. -Cardiology consult for heart rate control-might need amiodarone -Will continue Eliquis for now   Tachycardia Improving. Presumed secondary to sepsis in setting of multilobar pneumonia Metoprolol  5 mg IV every 2 hours as needed for heart rate greater than 120, 3 doses ordered  Pyuria Large leukocytes on UA, and in setting of altered mentation with possible sepsis, patient likely has UTI on admission Continue  with ceftriaxone 2 g IV daily  Elevated troponin Presumed secondary to demand ischemia, low clinical suspicion for ACS at this time  Dyspnea Secondary to multilobar pneumonia DuoNebs 4 times daily,  Will use low-dose morphine to help with air hunger  DNR (do not resuscitate) Per ACP documentation, healthcare power of attorney and declaration of desire for natural death.  Anxiety Ativan 0.5 mg IV every 6 hours as needed for anxiety, 3 doses ordered  Frequent falls Fall precautions   Subjective: Patient was seen and examined today.  Denies any pain.  She wants to go home.  Physical Exam: Vitals:   10/19/22 0258 10/19/22 0500 10/19/22 0730 10/19/22 1114  BP: (!) 100/59 102/64 117/85 101/66  Pulse: (!) 124 (!) 115 (!) 133 (!) 134  Resp:  Temp:  98 F (36.7 C) 98.6 F (37 C) 98 F (36.7 C)  TempSrc:  Oral Oral Axillary  SpO2: (!) 82% 96% 94% 97%  Weight:      Height:       General.  Frail elderly lady, in no acute distress. Pulmonary.  Lungs clear bilaterally, normal respiratory effort. CV.  Irregularly irregular with tachycardia Abdomen.  Soft, nontender, nondistended, BS positive. CNS.  Alert and oriented to self only.  No focal neurologic deficit. Extremities.  No edema, no cyanosis, pulses intact and symmetrical. Psychiatry.  Judgment and insight appears impaired..  Data Reviewed: Prior data reviewed  Family Communication: Discussed with daughter on phone.  Disposition: Status is: Inpatient Remains inpatient appropriate because: Severity of illness  Planned Discharge Destination: Hospice versus back to facility  DVT prophylaxis.  Eliquis Time spent: 44 minutes.  This record has been created using Conservation officer, historic buildings. Errors have been sought and corrected,but may not always be located. Such creation errors do not reflect on the standard of care.   Author: Arnetha Courser, MD 10/19/2022 1:13 PM  For on call review [email or web address].

## 2022-10-19 NOTE — Care Management Important Message (Signed)
Important Message  Patient Details  Name: Mindy Garza MRN: [id number] Date of Birth: 1925-09-17   Medicare Important Message Given:  N/A - LOS <3 / Initial given by admissions     Johnell Comings 10/19/2022, 8:33 AM

## 2022-10-19 NOTE — Plan of Care (Signed)

## 2022-10-20 DIAGNOSIS — J189 Pneumonia, unspecified organism: Secondary | ICD-10-CM

## 2022-10-20 DIAGNOSIS — A419 Sepsis, unspecified organism: Secondary | ICD-10-CM | POA: Diagnosis not present

## 2022-10-20 DIAGNOSIS — R0602 Shortness of breath: Secondary | ICD-10-CM

## 2022-10-20 LAB — CBC
HCT: 41.5 % (ref 36.0–46.0)
Hemoglobin: 12.9 g/dL (ref 12.0–15.0)
MCH: 29.4 pg (ref 26.0–34.0)
MCHC: 31.1 g/dL (ref 30.0–36.0)
MCV: 94.5 fL (ref 80.0–100.0)
Platelets: 425 10*3/uL — ABNORMAL HIGH (ref 150–400)
RBC: 4.39 MIL/uL (ref 3.87–5.11)
RDW: 15.7 % — ABNORMAL HIGH (ref 11.5–15.5)
WBC: 11.2 10*3/uL — ABNORMAL HIGH (ref 4.0–10.5)
nRBC: 0 % (ref 0.0–0.2)

## 2022-10-20 LAB — BASIC METABOLIC PANEL
Anion gap: 11 (ref 5–15)
BUN: 17 mg/dL (ref 8–23)
CO2: 28 mmol/L (ref 22–32)
Calcium: 8.4 mg/dL — ABNORMAL LOW (ref 8.9–10.3)
Chloride: 101 mmol/L (ref 98–111)
Creatinine, Ser: 0.6 mg/dL (ref 0.44–1.00)
GFR, Estimated: >60 mL/min (ref >60)
Glucose, Bld: 87 mg/dL (ref 70–99)
Potassium: 3.8 mmol/L (ref 3.5–5.1)
Sodium: 140 mmol/L (ref 135–145)

## 2022-10-20 LAB — PROCALCITONIN: Procalcitonin: 0.32 ng/mL

## 2022-10-20 MED ORDER — MIDODRINE HCL 5 MG PO TABS
5.0000 mg | ORAL_TABLET | Freq: Three times a day (TID) | ORAL | Status: DC
  Administered 2022-10-20 – 2022-10-21 (×4): 5 mg via ORAL
  Filled 2022-10-20 (×4): qty 1

## 2022-10-20 MED ORDER — DILTIAZEM HCL 30 MG PO TABS
60.0000 mg | ORAL_TABLET | Freq: Two times a day (BID) | ORAL | Status: DC
  Administered 2022-10-20 – 2022-10-21 (×2): 60 mg via ORAL
  Filled 2022-10-20 (×3): qty 2

## 2022-10-20 NOTE — Care Management Important Message (Signed)
Important Message  Patient Details  Name: Mindy Garza MRN: [id number] Date of Birth: [birth year]   Medicare Important Message Given:  Other (see comment)  Patient will return to the facility with Hospice. Out of respect for the patient and family no Important Message from [facility] given.    Olegario Messier A Ory Elting 10/20/2022, 2:24 PM

## 2022-10-20 NOTE — Progress Notes (Signed)
Progress Note   Patient: Mindy Garza:[id number] DOB: 01/07/1926 DOA: 10/16/2022     4 DOS: the patient was seen and examined on 10/20/2022   Brief hospital course: Ms. Viktorya Arguijo is a 89 year old female with history of chronic hypoxic respiratory failure on 3 L nasal cannula at baseline, atrial fibrillation on Eliquis, anxiety, history of osteoarthritis, hypertension, who presents emergency department for chief concerns of shortness of breath.   Patient is from [facility] nursing facility.   Vitals showed temperature of 99.1, respiration rate of 16, heart rate 140, blood pressure 104/56, SpO2 of 95% on 2 L nasal cannula.   Serum sodium is 141, potassium 3.9, chloride 102, bicarb 29, BUN of 33, serum creatinine of 0.80, EGFR greater than 60, nonfasting blood glucose 138, WBC elevated at 16.3, hemoglobin 14.3, platelets of 385.   BNP was elevated at 520.3.  High since troponin was 40.  Lactic acid was 1.9.  COVID/influenza A/influenza B/RSV PCR were negative.  Assessment and Plan: Sepsis due to pneumonia Right upper lobe and left lower lobe pneumonia. Patient met severe sepsis criteria, lactic acidosis elevated which can make her in septic shock.  Lactic acidosis improved.  Preliminary blood cultures negative.  Procalcitonin improving.  Blood pressure improved Family would like to keep focus on comfort with some antibiotics to see her response.  No pressors. -Continued ceftriaxone and Zithromax for 3 day course -Palliative care consult-daughter is interested going back to her facility with hospice help - duonebs PRN  Chronic atrial fibrillation (HCC) with RVR Remained in RVR with heart rate in 130s most of the time despite increasing the dose of metoprolol. -Cardiology consult for heart rate control  - metoprolol increased to TID dosing - continue Eliquis  Elevated troponin Presumed secondary to demand ischemia, low clinical suspicion for ACS at this time  DNR (do not resuscitate)   palliative medicine Per ACP documentation, healthcare power of attorney and declaration of desire for natural death. Daughter expressed no escalation of life-prolonging measures or invasive procedures.  Anxiety Ativan 0.5 mg IV every 6 hours as needed for anxiety, 3 doses ordered  Frequent falls Fall precautions  Subjective: Patient reports no complaints. She is thirsty so gave her a drink of water and she thanked me. She is pleasant and easily falls asleep when not in active conversation.   Physical Exam: Vitals:   10/19/22 1651 10/19/22 2114 10/19/22 2120 10/20/22 0006  BP: 102/66 96/75 96/75  102/71  Pulse: (!) 133 (!) 128 (!) 128 (!) 130  Resp: Temp: 98.1 F (36.7 C)  97.7 F (36.5 C) 98.1 F (36.7 C)  TempSrc:   Oral Oral  SpO2: 95%  100%   Weight:      Height:       General.  Frail elderly lady, in no acute distress. Pulmonary.  Lungs clear bilaterally, normal respiratory effort. CV.  Irregularly irregular with tachycardia Abdomen.  Soft, nontender, nondistended, BS positive. CNS.  Alert and oriented to self only.  No focal neurologic deficit. Extremities.  No edema, no cyanosis, pulses intact and symmetrical. Psychiatry.  Judgment and insight appears impaired..  Data Reviewed: Prior data reviewed  Family Communication: none at bedside  Disposition: Status is: Inpatient. Likely dc 4/25 if HR is better controlled.  Remains inpatient appropriate because: Severity of illness  Planned Discharge Destination: Hospice back to prior facility  DVT prophylaxis.  Eliquis Time spent: 45 minutes.   Author: Leeroy Bock, MD 10/20/2022 7:34 AM  For  on call review [email or web address].

## 2022-10-20 NOTE — Progress Notes (Signed)
                                                     Palliative Care Progress Note   Patient Name: Mindy Garza       Date: 10/20/2022 DOB: January 26, 1926  Age: 89 y.o. MRN#: [id number] Attending Physician: Leeroy Bock, MD Primary Care Physician: Marguarite Arbour, MD Admit Date: 10/16/2022  Chart reviewed.  No acute palliative needs today.  Plan remains for patient to return to LTC with hospice services to follow.  BMP will continue to follow the patient and monitor her peripherally.  Please reengage with PMD if goals change, patient/family's request, or if patient's health deteriorates during this hospitalization.  Thank you for allowing the Palliative Medicine Team to assist in the care of Mindy Garza.  Samara Deist L. Manon Hilding, FNP-BC Palliative Medicine Team Team Phone # [phone]  No charge

## 2022-10-20 NOTE — Progress Notes (Signed)
[facility] CLINIC CARDIOLOGY CONSULT NOTE       Patient ID: Mindy Garza MRN: [id number] DOB/AGE: 1925-07-21 89 y.o.  Admit date: 10/16/2022 Referring Physician Dr. Arnetha Courser  Primary Physician Dr. Aram Beecham Primary Cardiologist Dr. Juliann Pares Reason for Consultation AF RVR   HPI: Mindy Garza is a 89yoF with a PMH of permanent atrial fibrillation (eliquis), chronic respiratory failure (3L baseline), osteoarthritis, HTN, breast cancer, dementia who presented to [facility] ED 10/16/2022 from [facility] facility with shortness of breath on her baseline oxygen.  Cardiology is consulted on hospital day 3 for assistance with her AF RVR.  Interval History:  - no acute events - sleeping with eyes closed, awakens to voice but quickly falls back to sleep  - on baseline O2, HR 130s overnight in atrial flutter, documented later morning in 80s-90s  Review of systems limited by patient's baseline dementia   Past Medical History:  Diagnosis Date   Atrial fibrillation    followed by Mariel Kansky, MD   Cancer 2001    right breast cancer diagnosed December 02, 2005 status post modified radical mastectomy. The patient had had an invasive carcinoma recurrence in five years after treatment for DCIS with wide excision and postoperative radiation therapy. Her tumor was estrogen positive. 0/15 nodes were negative. An axillary dissection was completed as a sentinel node could not be identified.    Malignant neoplasm of upper-outer quadrant of female breast    Right, T1a, N0, M0 ER-positive, PR negative, HER-2/neu not overexpressing.     Osteoporosis 2012   Personal history of colonic polyps 2010   tubulovillous polyp of the appendiceal orifice     Personal history of malignant neoplasm of breast 2001,2007    Past Surgical History:  Procedure Laterality Date   APPENDECTOMY     BREAST LUMPECTOMY Right 2001   COLON RESECTION  1994   COLONOSCOPY  2010, 2014   Dr. Lemar Livings   HAND SURGERY     MASTECTOMY Right  2007   SKIN CANCER EXCISION  2009   TUBAL LIGATION      Medications Prior to Admission  Medication Sig Dispense Refill Last Dose   acetaminophen (TYLENOL) 650 MG CR tablet Take 1,300 mg by mouth every 8 (eight) hours as needed for pain.   10/16/2022 at 1004   apixaban (ELIQUIS) 2.5 MG TABS tablet Take 2.5 mg by mouth 2 (two) times daily.    10/16/2022 at 1004   brimonidine-timolol (COMBIGAN) 0.2-0.5 % ophthalmic solution Place 1 drop into both eyes every 12 (twelve) hours.   10/16/2022 at 1005   Calcium Carbonate-Vitamin D (CALCIUM 500/VITAMIN D PO) Take 1,500 mg by mouth daily.   10/16/2022 at 1004   desvenlafaxine (PRISTIQ) 50 MG 24 hr tablet Take 50 mg by mouth daily.   10/16/2022 at 1004   divalproex (DEPAKOTE SPRINKLE) 125 MG capsule Take 125 mg by mouth 2 (two) times daily.   10/16/2022 at 1004   doxycycline (VIBRA-TABS) 100 MG tablet Take 100 mg by mouth 2 (two) times daily.   10/16/2022 at 1004   erythromycin ophthalmic ointment Place 1 Application into both eyes at bedtime.   10/15/2022 at 2149   fish oil-omega-3 fatty acids 1000 MG capsule Take 2 g by mouth daily.   10/16/2022 at 1004   furosemide (LASIX) 20 MG tablet Take 10 mg by mouth daily.   10/16/2022 at 1004   latanoprost (XALATAN) 0.005 % ophthalmic solution Place 1 drop into both eyes at bedtime.   10/15/2022 at  2149   LORazepam (ATIVAN) 0.5 MG tablet Take 0.5 mg by mouth 2 (two) times daily.   10/16/2022 at 1004   melatonin 5 MG TABS Take 10 mg by mouth at bedtime.   10/15/2022 at 2149   Multiple Vitamin (MULTIVITAMIN) tablet Take 1 tablet by mouth daily.   10/16/2022 at 1004   polyethylene glycol (MIRALAX / GLYCOLAX) 17 g packet Take 17 g by mouth daily.   10/16/2022 at 1004   potassium chloride (KLOR-CON M) 10 MEQ tablet Take 10 mEq by mouth daily.   10/16/2022 at 1004   traMADol (ULTRAM) 50 MG tablet Take 50 mg by mouth 2 (two) times daily as needed for moderate pain.   10/16/2022 at 1004   brimonidine (ALPHAGAN) 0.2 % ophthalmic  solution Place 1 drop into both eyes 2 (two) times daily. (Patient not taking: Reported on 10/16/2022)   Not Taking   potassium chloride SA (KLOR-CON) 20 MEQ tablet Take 10 mEq by mouth daily. 1/2 tab once daily (Patient not taking: Reported on 10/16/2022)   Not Taking   timolol (TIMOPTIC) 0.5 % ophthalmic solution Place 1 drop into both eyes 2 (two) times daily. (Patient not taking: Reported on 10/16/2022)   Not Taking   vitamin E 1000 UNIT capsule Take 1,000 Units by mouth daily. (Patient not taking: Reported on 10/16/2022)   Not Taking   Social History   Socioeconomic History   Marital status: Widowed    Spouse name: Not on file   Number of children: Not on file   Years of education: Not on file   Highest education level: Not on file  Occupational History   Not on file  Tobacco Use   Smoking status: Never   Smokeless tobacco: Never  Substance and Sexual Activity   Alcohol use: No   Drug use: No   Sexual activity: Not Currently  Other Topics Concern   Not on file  Social History Narrative   Not on file   Social Determinants of Health   Financial Resource Strain: Not on file  Food Insecurity: No Food Insecurity (10/17/2022)   Hunger Vital Sign    Worried About Running Out of Food in the Last Year: Never true    Ran Out of Food in the Last Year: Never true  Transportation Needs: No Transportation Needs (10/17/2022)   PRAPARE - Administrator, Civil Service (Medical): No    Lack of Transportation (Non-Medical): No  Physical Activity: Not on file  Stress: Not on file  Social Connections: Not on file  Intimate Partner Violence: Unknown (10/17/2022)   Humiliation, Afraid, Rape, and Kick questionnaire    Fear of Current or Ex-Partner: Not on file    Emotionally Abused: No    Physically Abused: No    Sexually Abused: No    Family History  Problem Relation Age of Onset   Breast cancer Other       Intake/Output Summary (Last 24 hours) at 10/20/2022 1028 Last data  filed at 10/20/2022 0900 Gross per 24 hour  Intake 340 ml  Output --  Net 340 ml     Vitals:   10/19/22 2114 10/19/22 2120 10/20/22 0006 10/20/22 0833  BP: 96/75 96/75 102/71 123/85  Pulse: (!) 128 (!) 128 (!) 130 82  Resp:  18 18 16   Temp:  97.7 F (36.5 C) 98.1 F (36.7 C) 97.9 F (36.6 C)  TempSrc:  Oral Oral   SpO2:  100%  93%  Weight:  Height:        PHYSICAL EXAM General: elderly and ill appearing caucasian female, in no acute distress. Laying at incline in bed, briefly awakens to voice before falling back to sleep HEENT:  Normocephalic and atraumatic. Poor dentition. Hard of hearing Neck:  No JVD.  Lungs: Normal respiratory effort on O2 by [place]. Clear bilaterally to auscultation. No wheezes, crackles, rhonchi.  Heart: tachy irregular irregular . Normal S1 and S2 without gallops or murmurs.  Abdomen: Non-distended appearing.  Msk: Normal strength and tone for age. Extremities: Warm and well perfused. No clubbing, cyanosis. No peripheral edema.  Neuro: somnolent, awakens briefly to voice. Psych: unable to assess.   Labs: Basic Metabolic Panel: Recent Labs    10/19/22 1439 10/20/22 0518  NA  --  140  K  --  3.8  CL  --  101  CO2  --  28  GLUCOSE  --  87  BUN  --  17  CREATININE  --  0.60  CALCIUM  --  8.4*  MG 2.0  --     Liver Function Tests: No results for input(s): "AST", "ALT", "ALKPHOS", "BILITOT", "PROT", "ALBUMIN" in the last 72 hours. No results for input(s): "LIPASE", "AMYLASE" in the last 72 hours. CBC: Recent Labs    10/20/22 0518  WBC 11.2*  HGB 12.9  HCT 41.5  MCV 94.5  PLT 425*    Cardiac Enzymes: No results for input(s): "CKTOTAL", "CKMB", "CKMBINDEX", "TROPONINIHS" in the last 72 hours. BNP: No results for input(s): "BNP" in the last 72 hours. D-Dimer: No results for input(s): "DDIMER" in the last 72 hours. Hemoglobin A1C: No results for input(s): "HGBA1C" in the last 72 hours. Fasting Lipid Panel: No results for  input(s): "CHOL", "HDL", "LDLCALC", "TRIG", "CHOLHDL", "LDLDIRECT" in the last 72 hours. Thyroid Function Tests: No results for input(s): "TSH", "T4TOTAL", "T3FREE", "THYROIDAB" in the last 72 hours.  Invalid input(s): "FREET3" Anemia Panel: No results for input(s): "VITAMINB12", "FOLATE", "FERRITIN", "TIBC", "IRON", "RETICCTPCT" in the last 72 hours.   Radiology: [facility] Chest Port 1 View  Result Date: 10/16/2022 CLINICAL DATA:  Sepsis EXAM: PORTABLE CHEST 1 VIEW COMPARISON:  Chest x-ray 07/01/2014 and older. Report of the study from 01/02/2019 FINDINGS: Enlarged cardiopericardial silhouette. Small effusions. No pneumothorax. Focal asymmetric opacity along the inferior aspect of the right upper lobe. Acute infiltrates possible. Is also some mild opacity in the left lung base. No pneumothorax. Overlapping cardiac leads. Surgical clips in the right axillary region IMPRESSION: Focal opacity seen right upper lobe and left lung base. Possible acute infiltrate. Enlarged heart with tiny effusions. Electronically Signed   By: Karen Kays M.D.   On: 10/16/2022 13:18    ECHO 12/01/2018 DOPPLER ECHO and OTHER SPECIAL PROCEDURES                 Aortic: MILD AR                    No AS                         195.5 cm/sec peak vel      15.3 mmHg peak grad                 Mitral: MILD MR                    No MS  MV Inflow E Vel = 88.3 cm/sec     MV Annulus E'Vel = 6.1 cm/sec                         E/E'Ratio = 14.5              Tricuspid: SEVERE TR                  No TS                         312.2 cm/sec peak TR vel   42.0 mmHg peak RV pressure              Pulmonary: TRIVIAL PR                 No PS  _________________________________________________________________________________________  INTERPRETATION  NORMAL LEFT VENTRICULAR SYSTOLIC FUNCTION  NORMAL RIGHT VENTRICULAR SYSTOLIC FUNCTION  SEVERE VALVULAR REGURGITATION (See above)  NO VALVULAR STENOSIS  Severe TR with dilated  RA and dilated IVC  _________________________________________________________________________________________  Electronically signed by      Danella Penton, MD on 12/04/2018 06: 14 PM           Performed By: Johnathan Hausen     Ordering Physician: Percell Locus reviewed by me (LT) 10/20/2022 : none available  EKG reviewed by me: atrial flutter RVR rate 141  Data reviewed by me (LT) 10/20/2022: ed note, palliative care note, hospitalist progress ntoe and admission H&P, last 24h vitals tele labs imaging I/O    Principal Problem:   Sepsis due to pneumonia Active Problems:   Chronic atrial fibrillation (HCC)   Tachycardia   Frequent falls   Anxiety   Multifocal pneumonia   DNR (do not resuscitate)   Elevated troponin   Dyspnea   Pyuria    ASSESSMENT AND PLAN:  Mindy Garza is a 89yoF with a PMH of permanent atrial fibrillation (eliquis), chronic respiratory failure (3L baseline), osteoarthritis, HTN, breast cancer, dementia who presented to [facility] ED 10/16/2022 from [facility] nursing facility with shortness of breath on her baseline oxygen.  Cardiology is consulted on hospital day 3 for assistance with her AF RVR.  # Multifocal pneumonia # Chronic respiratory failure # Dementia # Permanent atrial fibrillation with RVR Presents from [facility] nursing facility with shortness of breath, chest x-ray and labs consistent with multifocal pneumonia for which she is being treated for with IV antibiotics.  She has been seen by palliative care and the patient's daughter has clear goals to primarily focus on the patient's comfort and to not escalate care.  She is a DNR and plan is to go home with hospice services following hospital discharge.  She has a history of permanent atrial fibrillation, suspect that her rapid heart rates during this admission (120s-130s by pulse ox) are elevated in the setting of pneumonia. -Agree with current therapy, appreciate palliative care assistance -Continue to  treat causes of increased adrenergic tone including pain, hypoxia, electrolyte disturbances, infection, etc. -continue metoprolol tartrate 25 mg 3 times daily -add back home cardizem at 60mg  PO BID  -add midodrine 5mg  TID with relative low BP  -Continue Eliquis 2.5 mg twice daily for stroke prevention. -Consider antiarrhythmics (amiodarone) if heart rate remains elevated and blood pressure does not tolerate doses of BB/CCB, although not preferred as the patient is on azithromycin, scheduled Ativan, and PRN Zofran (QT prolonging medications) currently. -Defer additional cardiac diagnostics  This patient's plan of care was discussed and created with Dr. Juliann Pares and he is in agreement.  Signed: Rebeca Allegra , PA-C 10/20/2022, 10:28 AM [facility]

## 2022-10-21 DIAGNOSIS — R0602 Shortness of breath: Secondary | ICD-10-CM | POA: Diagnosis not present

## 2022-10-21 DIAGNOSIS — A419 Sepsis, unspecified organism: Secondary | ICD-10-CM | POA: Diagnosis not present

## 2022-10-21 DIAGNOSIS — J189 Pneumonia, unspecified organism: Secondary | ICD-10-CM | POA: Diagnosis not present

## 2022-10-21 LAB — CULTURE, BLOOD (ROUTINE X 2)
Culture: NO GROWTH
Special Requests: ADEQUATE

## 2022-10-21 MED ORDER — METOPROLOL TARTRATE 25 MG PO TABS: 25.0000 mg | ORAL_TABLET | Freq: Three times a day (TID) | ORAL | Status: DC

## 2022-10-21 MED ORDER — TRAMADOL HCL 50 MG PO TABS: 50.0000 mg | ORAL_TABLET | Freq: Two times a day (BID) | ORAL | 0 refills | Status: DC | PRN

## 2022-10-21 MED ORDER — MIDODRINE HCL 5 MG PO TABS: 5.0000 mg | ORAL_TABLET | Freq: Three times a day (TID) | ORAL | Status: DC

## 2022-10-21 NOTE — TOC Progression Note (Signed)
Transition of Care [facility]) - Progression Note    Patient Details  Name: Mindy Garza MRN: [id number] Date of Birth: Jan 06, 1926  Transition of Care [facility]) CM/SW Contact  Marlowe Sax, RN Phone Number: 10/21/2022, 10:57 AM  Clinical Narrative:     Spoke with daughter Lupita Leash, notified that she will return to [facility] today, EMS called and arranged transport to go to room B8    Barriers to Discharge: No Barriers Identified  Expected Discharge Plan and Services   Discharge Planning Services: CM Consult   Living arrangements for the past 2 months: Skilled Nursing Facility Expected Discharge Date: 10/21/22               DME Arranged: N/A         HH Arranged: NA           Social Determinants of Health (SDOH) Interventions SDOH Screenings   Food Insecurity: No Food Insecurity (10/17/2022)  Housing: Low Risk  (10/17/2022)  Transportation Needs: No Transportation Needs (10/17/2022)  Tobacco Use: Low Risk  (10/16/2022)    Readmission Risk Interventions     No data to display

## 2022-10-21 NOTE — NC FL2 (Signed)
[place] MEDICAID FL2 LEVEL OF CARE FORM     IDENTIFICATION  Patient Name: Mindy Garza Birthdate: Nov 07, 1925 Sex: female Admission Date (Current Location): 10/16/2022  [place] and [place] Number:  Chiropodist and Address:  [facility], [address] [place], [place] [address]      Provider Number: [id number]  Attending Physician Name and Address:  Leeroy Bock, MD  Relative Name and Phone Number:  Lupita Leash [phone]    Current Level of Care: Hospital Recommended Level of Care: Skilled Nursing Facility Prior Approval Number:    Date Approved/Denied:   PASRR Number:    Discharge Plan: SNF    Current Diagnoses: Patient Active Problem List   Diagnosis Date Noted   Community acquired pneumonia 10/20/2022   SOB (shortness of breath) 10/20/2022   Multifocal pneumonia 10/16/2022   DNR (do not resuscitate) 10/16/2022   Elevated troponin 10/16/2022   Sepsis due to pneumonia 10/16/2022   Dyspnea 10/16/2022   Pyuria 10/16/2022   Frequent falls 05/13/2020   Anxiety 05/13/2020   Acute lower UTI 05/13/2020   Chronic atrial fibrillation (HCC) 12/31/2013   Tachycardia 12/31/2013   Personal history of breast cancer 12/31/2013   Recurrent breast cancer 09/18/2012   Personal history of colonic polyps     Orientation RESPIRATION BLADDER Height & Weight     Self, Situation  Normal Incontinent Weight: 60.2 kg Height:   (160 cm)  BEHAVIORAL SYMPTOMS/MOOD NEUROLOGICAL BOWEL NUTRITION STATUS      Incontinent Diet (See DC summary)  AMBULATORY STATUS COMMUNICATION OF NEEDS Skin   Extensive Assist Verbally Normal                       Personal Care Assistance Level of Assistance  Bathing, Dressing, Feeding Bathing Assistance: Maximum assistance Feeding assistance: Limited assistance Dressing Assistance: Maximum assistance     Functional Limitations Info             SPECIAL CARE FACTORS FREQUENCY                        Contractures Contractures Info: Not present    Additional Factors Info  Code Status, Allergies Code Status Info: DNR Allergies Info: NKDA           Current Medications (10/21/2022):  This is the current hospital active medication list Current Facility-Administered Medications  Medication Dose Route Frequency Provider Last Rate Last Admin   acetaminophen (TYLENOL) tablet 650 mg  650 mg Oral Q6H PRN Cox, Amy N, DO   650 mg at 10/21/22 6578   Or   acetaminophen (TYLENOL) suppository 650 mg  650 mg Rectal Q6H PRN Cox, Amy N, DO       apixaban (ELIQUIS) tablet 2.5 mg  2.5 mg Oral BID Cox, Amy N, DO   2.5 mg at 10/21/22 0907   brimonidine (ALPHAGAN) 0.2 % ophthalmic solution 1 drop  1 drop Both Eyes BID Selinda Eon, RPH   1 drop at 10/21/22 4696   And   timolol (TIMOPTIC) 0.5 % ophthalmic solution 1 drop  1 drop Both Eyes BID Selinda Eon, RPH   1 drop at 10/21/22 2952   diltiazem (CARDIZEM) tablet 60 mg  60 mg Oral BID Rebeca Allegra, PA-C   60 mg at 10/21/22 8413   divalproex (DEPAKOTE SPRINKLE) capsule 125 mg  125 mg Oral BID Cox, Amy N, DO   125 mg at 10/21/22 [phone]  erythromycin ophthalmic ointment 1 Application  1 Application Both Eyes QHS Cox, Amy N, DO   1 Application at 10/20/22 2057   latanoprost (XALATAN) 0.005 % ophthalmic solution 1 drop  1 drop Both Eyes QHS Cox, Amy N, DO   1 drop at 10/20/22 2103   LORazepam (ATIVAN) tablet 0.5 mg  0.5 mg Oral BID Cox, Amy N, DO   0.5 mg at 10/21/22 1610   metoprolol tartrate (LOPRESSOR) tablet 25 mg  25 mg Oral TID Rebeca Allegra, PA-C   25 mg at 10/21/22 0906   midodrine (PROAMATINE) tablet 5 mg  5 mg Oral TID WC Tang, Cheryln Manly, PA-C   5 mg at 10/21/22 9604   morphine (PF) 2 MG/ML injection 1 mg  1 mg Intravenous Q2H PRN Arnetha Courser, MD   1 mg at 10/20/22 2012   ondansetron (ZOFRAN) tablet 4 mg  4 mg Oral Q6H PRN Cox, Amy N, DO       Or   ondansetron (ZOFRAN) injection 4 mg  4 mg Intravenous Q6H PRN  Cox, Amy N, DO         Discharge Medications: Please see discharge summary for a list of discharge medications.  Relevant Imaging Results:  Relevant Lab Results:   Additional Information SS#: [id number].  Marlowe Sax, RN

## 2022-10-21 NOTE — Discharge Summary (Addendum)
Physician Discharge Summary  Patient: Mindy Garza NWG:[id number] DOB: Dec 02, 1925   Code Status: DNR Admit date: 10/16/2022 Discharge date: 10/21/2022 Disposition: Skilled nursing facility, PT, OT, SLP, nurse aid, and RN PCP: Marguarite Arbour, MD  Recommendations for Outpatient Follow-up:  Follow up with PCP within 1-2 weeks Regarding general hospital follow up and preventative care Recommend monitoring and assessing back pain Follow up with cardiology Regarding atrial fibrillation   Discharge Diagnoses:  Principal Problem:   Sepsis due to pneumonia Active Problems:   Multifocal pneumonia   Chronic atrial fibrillation (HCC)   Tachycardia   Pyuria   Elevated troponin   Dyspnea   Frequent falls   Anxiety   DNR (do not resuscitate)   Community acquired pneumonia   SOB (shortness of breath)  Brief Hospital Course Summary: [name] is a 89 year old female with history of chronic hypoxic respiratory failure on 3 L nasal cannula at baseline, atrial fibrillation on Eliquis, anxiety, history of osteoarthritis, hypertension, who presents emergency department for chief concerns of shortness of breath.   Patient is from [facility] nursing facility.   ED course: temperature 99.1, respiration rate of 16, heart rate 140, blood pressure 104/56, SpO2 of 95% on 2 L nasal cannula.   Serum sodium is 141, potassium 3.9, chloride 102, bicarb 29, BUN of 33, serum creatinine of 0.80, EGFR greater than 60, nonfasting blood glucose 138, WBC elevated at 16.3, hemoglobin 14.3, platelets of 385.   BNP was elevated at 520.3.  High since troponin was 40.  Lactic acid was 1.9.  COVID/influenza A/influenza B/RSV PCR were negative. Chest xray suspicious for pneumonia.   Initial treatment: she was started on antibiotic for PNA and cardiology was consulted for management of afib RVR.   Continued 3 day treatment of Abx for PNA.  Palliative care was consulted and family decided to accept hospice care  which will follow once discharged back to facility. She had intermittent oxygen use during admission and on day of discharge she was stable on 3L [place] to remain >90%.  Cardiology modified her medications and eventually able to rate control to 70s when splitting her metoprolol in TID dosing. She remained rate controlled and asymptomatic >24 hours prior to discharge.   She complained of chronic lower back pain which was treated with tylenol and heating pad.    Discharge Condition: Stable, improved Recommended discharge diet: Regular healthy diet  Consultations: Cardiology   Procedures/Studies: None   Allergies as of 10/21/2022   No Known Allergies      Medication List     STOP taking these medications    brimonidine 0.2 % ophthalmic solution Commonly known as: ALPHAGAN   desvenlafaxine 50 MG 24 hr tablet Commonly known as: PRISTIQ   doxycycline 100 MG tablet Commonly known as: VIBRA-TABS   fish oil-omega-3 fatty acids 1000 MG capsule   furosemide 20 MG tablet Commonly known as: LASIX   potassium chloride 10 MEQ tablet Commonly known as: KLOR-CON M   potassium chloride SA 20 MEQ tablet Commonly known as: KLOR-CON M   timolol 0.5 % ophthalmic solution Commonly known as: TIMOPTIC   vitamin E 1000 UNIT capsule       TAKE these medications    acetaminophen 650 MG CR tablet Commonly known as: TYLENOL Take 1,300 mg by mouth every 8 (eight) hours as needed for pain.   CALCIUM 500/VITAMIN D PO Take 1,500 mg by mouth daily.   Combigan 0.2-0.5 % ophthalmic solution Generic drug: brimonidine-timolol Place 1  drop into both eyes every 12 (twelve) hours.   divalproex 125 MG capsule Commonly known as: DEPAKOTE SPRINKLE Take 125 mg by mouth 2 (two) times daily.   Eliquis 2.5 MG Tabs tablet Generic drug: apixaban Take 2.5 mg by mouth 2 (two) times daily.   erythromycin ophthalmic ointment Place 1 Application into both eyes at bedtime.   latanoprost 0.005 %  ophthalmic solution Commonly known as: XALATAN Place 1 drop into both eyes at bedtime.   LORazepam 0.5 MG tablet Commonly known as: ATIVAN Take 0.5 mg by mouth 2 (two) times daily.   melatonin 5 MG Tabs Take 10 mg by mouth at bedtime.   metoprolol tartrate 25 MG tablet Commonly known as: LOPRESSOR Take 1 tablet (25 mg total) by mouth 3 (three) times daily.   midodrine 5 MG tablet Commonly known as: PROAMATINE Take 1 tablet (5 mg total) by mouth 3 (three) times daily with meals.   multivitamin tablet Take 1 tablet by mouth daily.   polyethylene glycol 17 g packet Commonly known as: MIRALAX / GLYCOLAX Take 17 g by mouth daily.   traMADol 50 MG tablet Commonly known as: ULTRAM Take 1 tablet (50 mg total) by mouth 2 (two) times daily as needed for moderate pain.         Subjective   Pt reports feeling well overall. Complains of terrible lower back pain. She received tylenol only a few seconds prior to our encounter so asked her to give it time to work and we can reassess. She was agreeable with this plan. She also received a heating pad and seemed to feel better.  No other concerns or questions.   All questions and concerns were addressed at time of discharge.  Objective  Blood pressure 121/71, pulse 74, temperature 98 F (36.7 C), resp. rate 16, height  (1.6 m), weight 60.2 kg, SpO2 92 %.   General: Pt is alert, awake, not in acute distress. Frail.  Cardiovascular: RRR, S1/S2 +, no rubs, no gallops Respiratory: CTA bilaterally, no wheezing, no rhonchi Abdominal: Soft, NT, ND, bowel sounds + Extremities: no edema, no cyanosis. Cold extremities.   The results of significant diagnostics from this hospitalization (including imaging, microbiology, ancillary and laboratory) are listed below for reference.   Imaging studies: DG Chest Port 1 View  Result Date: 10/16/2022 CLINICAL DATA:  Sepsis EXAM: PORTABLE CHEST 1 VIEW COMPARISON:  Chest x-ray 07/01/2014 and older.  Report of the study from 01/02/2019 FINDINGS: Enlarged cardiopericardial silhouette. Small effusions. No pneumothorax. Focal asymmetric opacity along the inferior aspect of the right upper lobe. Acute infiltrates possible. Is also some mild opacity in the left lung base. No pneumothorax. Overlapping cardiac leads. Surgical clips in the right axillary region IMPRESSION: Focal opacity seen right upper lobe and left lung base. Possible acute infiltrate. Enlarged heart with tiny effusions. Electronically Signed   By: Karen Kays M.D.   On: 10/16/2022 13:18    Labs: Basic Metabolic Panel: Recent Labs  Lab 10/16/22 1222 10/17/22 0842 10/19/22 1439 10/20/22 0518  NA 141 138  --  140  K 3.9 3.8  --  3.8  CL 102 102  --  101  CO2 29 27  --  28  GLUCOSE 138* 88  --  87  BUN 33* 21  --  17  CREATININE 0.80 0.54  --  0.60  CALCIUM 9.0 8.3*  --  8.4*  MG 1.8  --  2.0  --    CBC: Recent Labs  Lab  10/16/22 1222 10/17/22 0842 10/20/22 0518  WBC 16.3* 14.4* 11.2*  NEUTROABS 11.8*  --   --   HGB 14.3 12.3 12.9  HCT 46.4* 39.3 41.5  MCV 94.3 94.2 94.5  PLT 385 333 425*   Microbiology: Results for orders placed or performed during the hospital encounter of 10/16/22  Resp panel by RT-PCR (RSV, Flu A&B, Covid) Anterior Nasal Swab     Status: None   Collection Time: 10/16/22 12:22 PM   Specimen: Anterior Nasal Swab  Result Value Ref Range Status   SARS Coronavirus 2 by RT PCR NEGATIVE NEGATIVE Final    Comment: (NOTE) SARS-CoV-2 target nucleic acids are NOT DETECTED.  The SARS-CoV-2 RNA is generally detectable in upper respiratory specimens during the acute phase of infection. The lowest concentration of SARS-CoV-2 viral copies this assay can detect is 138 copies/mL. A negative result does not preclude SARS-Cov-2 infection and should not be used as the sole basis for treatment or other patient management decisions. A negative result may occur with  improper specimen collection/handling,  submission of specimen other than nasopharyngeal swab, presence of viral mutation(s) within the areas targeted by this assay, and inadequate number of viral copies(<138 copies/mL). A negative result must be combined with clinical observations, patient history, and epidemiological information. The expected result is Negative.  Fact Sheet for Patients:  BloggerCourse.com  Fact Sheet for Healthcare Providers:  SeriousBroker.it  This test is no t yet approved or cleared by the [place] FDA and  has been authorized for detection and/or diagnosis of SARS-CoV-2 by FDA under an Emergency Use Authorization (EUA). This EUA will remain  in effect (meaning this test can be used) for the duration of the COVID-19 declaration under Section 564(b)(1) of the Act, 21 U.S.C.section 360bbb-3(b)(1), unless the authorization is terminated  or revoked sooner.       Influenza A by PCR NEGATIVE NEGATIVE Final   Influenza B by PCR NEGATIVE NEGATIVE Final    Comment: (NOTE) The Xpert Xpress SARS-CoV-2/FLU/RSV plus assay is intended as an aid in the diagnosis of influenza from Nasopharyngeal swab specimens and should not be used as a sole basis for treatment. Nasal washings and aspirates are unacceptable for Xpert Xpress SARS-CoV-2/FLU/RSV testing.  Fact Sheet for Patients: BloggerCourse.com  Fact Sheet for Healthcare Providers: SeriousBroker.it  This test is not yet approved or cleared by the [place] FDA and has been authorized for detection and/or diagnosis of SARS-CoV-2 by FDA under an Emergency Use Authorization (EUA). This EUA will remain in effect (meaning this test can be used) for the duration of the COVID-19 declaration under Section 564(b)(1) of the Act, 21 U.S.C. section 360bbb-3(b)(1), unless the authorization is terminated or revoked.     Resp Syncytial Virus by PCR NEGATIVE  NEGATIVE Final    Comment: (NOTE) Fact Sheet for Patients: BloggerCourse.com  Fact Sheet for Healthcare Providers: SeriousBroker.it  This test is not yet approved or cleared by the [place] FDA and has been authorized for detection and/or diagnosis of SARS-CoV-2 by FDA under an Emergency Use Authorization (EUA). This EUA will remain in effect (meaning this test can be used) for the duration of the COVID-19 declaration under Section 564(b)(1) of the Act, 21 U.S.C. section 360bbb-3(b)(1), unless the authorization is terminated or revoked.  Performed at [facility], [address] [place], [place] [address]   Blood Culture (routine x 2)     Status: None   Collection Time: 10/16/22 12:22 PM   Specimen: BLOOD LEFT HAND  Result Value Ref Range Status   Specimen Description BLOOD LEFT HAND  Final   Special Requests   Final    BOTTLES DRAWN AEROBIC AND ANAEROBIC Blood Culture adequate volume   Culture   Final    NO GROWTH 5 DAYS Performed at [facility], [address], [place], [place] [address]    Report Status 10/21/2022 FINAL  Final  Blood Culture (routine x 2)     Status: None   Collection Time: 10/16/22 12:22 PM   Specimen: BLOOD RIGHT ARM  Result Value Ref Range Status   Specimen Description BLOOD RIGHT ARM  Final   Special Requests   Final    BOTTLES DRAWN AEROBIC AND ANAEROBIC Blood Culture adequate volume   Culture   Final    NO GROWTH 5 DAYS Performed at [facility], [address] [place], [place] [address]    Report Status 10/21/2022 FINAL  Final   Time coordinating discharge: Over 30 minutes  Leeroy Bock, MD  [facility] 10/21/2022, 10:42 AM

## 2022-11-27 DEATH — deceased
